# Patient Record
Sex: Male | Born: 1980
Health system: Southern US, Community
[De-identification: ages and names within clinical notes are randomized; demographics above are authoritative.]

## PROBLEM LIST (undated history)

## (undated) DIAGNOSIS — F988 Other specified behavioral and emotional disorders with onset usually occurring in childhood and adolescence: Secondary | ICD-10-CM

## (undated) HISTORY — PX: ARM WOUND REPAIR / CLOSURE: SUR1141

## (undated) HISTORY — DX: Other specified behavioral and emotional disorders with onset usually occurring in childhood and adolescence: F98.8

## (undated) HISTORY — PX: LESION REMOVAL: SHX5196

## (undated) HISTORY — PX: MANDIBLE FRACTURE SURGERY: SHX706

---

## 2018-05-27 DIAGNOSIS — T07XXXA Unspecified multiple injuries, initial encounter: Secondary | ICD-10-CM | POA: Insufficient documentation

## 2018-05-27 DIAGNOSIS — S02611A Fracture of condylar process of right mandible, initial encounter for closed fracture: Secondary | ICD-10-CM | POA: Insufficient documentation

## 2018-05-27 DIAGNOSIS — S43005A Unspecified dislocation of left shoulder joint, initial encounter: Secondary | ICD-10-CM | POA: Insufficient documentation

## 2018-05-27 DIAGNOSIS — S27321A Contusion of lung, unilateral, initial encounter: Secondary | ICD-10-CM | POA: Insufficient documentation

## 2018-05-27 DIAGNOSIS — S2242XA Multiple fractures of ribs, left side, initial encounter for closed fracture: Secondary | ICD-10-CM | POA: Insufficient documentation

## 2018-05-27 DIAGNOSIS — G8911 Acute pain due to trauma: Secondary | ICD-10-CM | POA: Insufficient documentation

## 2018-05-27 DIAGNOSIS — D735 Infarction of spleen: Secondary | ICD-10-CM | POA: Insufficient documentation

## 2018-05-27 HISTORY — DX: Acute pain due to trauma: G89.11

## 2018-05-27 HISTORY — DX: Contusion of lung, unilateral, initial encounter: S27.321A

## 2018-05-27 HISTORY — DX: Multiple fractures of ribs, left side, initial encounter for closed fracture: S22.42XA

## 2018-05-27 HISTORY — DX: Unspecified dislocation of left shoulder joint, initial encounter: S43.005A

## 2018-05-27 HISTORY — DX: Unspecified multiple injuries, initial encounter: T07.XXXA

## 2018-05-27 HISTORY — DX: Infarction of spleen: D73.5

## 2018-07-16 DIAGNOSIS — S46012A Strain of muscle(s) and tendon(s) of the rotator cuff of left shoulder, initial encounter: Secondary | ICD-10-CM

## 2018-07-16 DIAGNOSIS — R29898 Other symptoms and signs involving the musculoskeletal system: Secondary | ICD-10-CM | POA: Insufficient documentation

## 2018-07-16 DIAGNOSIS — M25511 Pain in right shoulder: Secondary | ICD-10-CM | POA: Insufficient documentation

## 2018-07-16 HISTORY — DX: Strain of muscle(s) and tendon(s) of the rotator cuff of left shoulder, initial encounter: S46.012A

## 2018-07-16 HISTORY — DX: Pain in right shoulder: M25.511

## 2018-10-04 ENCOUNTER — Other Ambulatory Visit: Payer: Self-pay

## 2018-10-04 NOTE — Telephone Encounter (Signed)
Pt needs refill of adderall but needs ov to rescheck BP prior to refill

## 2018-10-08 ENCOUNTER — Other Ambulatory Visit: Payer: Self-pay

## 2018-10-08 ENCOUNTER — Encounter: Payer: Self-pay | Admitting: Internal Medicine

## 2018-10-08 ENCOUNTER — Ambulatory Visit: Payer: Self-pay | Admitting: Internal Medicine

## 2018-10-08 VITALS — BP 130/89 | HR 108 | Temp 97.3°F | Resp 14 | Ht 75.0 in | Wt 238.0 lb

## 2018-10-08 DIAGNOSIS — E663 Overweight: Secondary | ICD-10-CM | POA: Insufficient documentation

## 2018-10-08 DIAGNOSIS — F909 Attention-deficit hyperactivity disorder, unspecified type: Secondary | ICD-10-CM

## 2018-10-08 DIAGNOSIS — R030 Elevated blood-pressure reading, without diagnosis of hypertension: Secondary | ICD-10-CM | POA: Insufficient documentation

## 2018-10-08 HISTORY — DX: Elevated blood-pressure reading, without diagnosis of hypertension: R03.0

## 2018-10-08 MED ORDER — AMPHETAMINE-DEXTROAMPHETAMINE 20 MG PO TABS: ORAL_TABLET | ORAL | 0 refills | Status: DC
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
## 2018-10-16 DIAGNOSIS — M7502 Adhesive capsulitis of left shoulder: Secondary | ICD-10-CM | POA: Insufficient documentation

## 2018-10-16 HISTORY — DX: Adhesive capsulitis of left shoulder: M75.02

## 2018-10-25 HISTORY — PX: SHOULDER SURGERY: SHX246

## 2018-11-06 ENCOUNTER — Other Ambulatory Visit: Payer: Self-pay | Admitting: Internal Medicine

## 2018-11-06 MED ORDER — AMPHETAMINE-DEXTROAMPHETAMINE 20 MG PO TABS: ORAL_TABLET | ORAL | 0 refills | Status: DC

## 2018-11-06 NOTE — Telephone Encounter (Signed)
He is requesting a refill on his adderall.   Anthony Madden s [address]

## 2018-11-06 NOTE — Telephone Encounter (Signed)
Last office visit in epic

## 2018-12-05 ENCOUNTER — Ambulatory Visit: Payer: Self-pay

## 2018-12-05 DIAGNOSIS — Z23 Encounter for immunization: Secondary | ICD-10-CM

## 2018-12-06 ENCOUNTER — Other Ambulatory Visit: Payer: Self-pay

## 2018-12-06 ENCOUNTER — Ambulatory Visit: Payer: Self-pay | Admitting: Internal Medicine

## 2018-12-06 ENCOUNTER — Encounter: Payer: Self-pay | Admitting: Internal Medicine

## 2018-12-06 VITALS — BP 135/95 | HR 98 | Temp 98.4°F | Resp 12 | Ht 76.0 in | Wt 252.0 lb

## 2018-12-06 DIAGNOSIS — Z6830 Body mass index (BMI) 30.0-30.9, adult: Secondary | ICD-10-CM

## 2018-12-06 DIAGNOSIS — E66811 Obesity, class 1: Secondary | ICD-10-CM

## 2018-12-06 DIAGNOSIS — E6609 Other obesity due to excess calories: Secondary | ICD-10-CM | POA: Insufficient documentation

## 2018-12-06 DIAGNOSIS — F909 Attention-deficit hyperactivity disorder, unspecified type: Secondary | ICD-10-CM

## 2018-12-06 DIAGNOSIS — R030 Elevated blood-pressure reading, without diagnosis of hypertension: Secondary | ICD-10-CM

## 2018-12-06 HISTORY — DX: Body mass index (BMI) 30.0-30.9, adult: Z68.30

## 2018-12-06 HISTORY — DX: Obesity, class 1: E66.811

## 2018-12-06 HISTORY — DX: Other obesity due to excess calories: E66.09

## 2018-12-06 MED ORDER — AMPHETAMINE-DEXTROAMPHETAMINE 20 MG PO TABS: ORAL_TABLET | ORAL | 0 refills | Status: DC
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
## 2018-12-06 NOTE — Progress Notes (Signed)
Left shoulder surgery 10/25/2018.  Still on PT (16 wks).  Follow-up appt with surgeon scheduled 0r 12/10/2018.  AMD

## 2018-12-11 ENCOUNTER — Encounter: Payer: Self-pay | Admitting: Occupational Medicine

## 2018-12-11 ENCOUNTER — Ambulatory Visit: Payer: 59 | Admitting: Occupational Medicine

## 2018-12-11 ENCOUNTER — Other Ambulatory Visit: Payer: Self-pay

## 2018-12-11 VITALS — BP 130/92 | HR 72 | Temp 97.1°F | Resp 12 | Ht 75.0 in | Wt 251.0 lb

## 2018-12-11 DIAGNOSIS — Z7689 Persons encountering health services in other specified circumstances: Secondary | ICD-10-CM
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
## 2018-12-26 ENCOUNTER — Ambulatory Visit: Payer: Self-pay

## 2018-12-26 DIAGNOSIS — Z021 Encounter for pre-employment examination: Secondary | ICD-10-CM

## 2018-12-28 LAB — POCT URINALYSIS DIPSTICK
Bilirubin, UA: NEGATIVE
Blood, UA: NEGATIVE
Glucose, UA: NEGATIVE
Ketones, UA: NEGATIVE
Leukocytes, UA: NEGATIVE
Nitrite, UA: NEGATIVE
Protein, UA: NEGATIVE
Spec Grav, UA: 1.01 (ref 1.010–1.025)
Urobilinogen, UA: 0.2 E.U./dL
pH, UA: 6 (ref 5.0–8.0)

## 2019-01-01 ENCOUNTER — Ambulatory Visit: Payer: 59 | Admitting: Occupational Medicine

## 2019-01-01 ENCOUNTER — Encounter: Payer: Self-pay | Admitting: Occupational Medicine

## 2019-01-01 ENCOUNTER — Other Ambulatory Visit: Payer: Self-pay

## 2019-01-01 VITALS — BP 130/80 | HR 88 | Temp 98.3°F | Resp 16 | Ht 75.0 in | Wt 255.0 lb

## 2019-01-01 DIAGNOSIS — Z0000 Encounter for general adult medical examination without abnormal findings: Secondary | ICD-10-CM

## 2019-01-09 ENCOUNTER — Encounter: Payer: Self-pay | Admitting: Internal Medicine

## 2019-01-09 ENCOUNTER — Other Ambulatory Visit: Payer: Self-pay | Admitting: Internal Medicine

## 2019-01-09 DIAGNOSIS — F909 Attention-deficit hyperactivity disorder, unspecified type: Secondary | ICD-10-CM

## 2019-01-09 MED ORDER — AMPHETAMINE-DEXTROAMPHETAMINE 20 MG PO TABS: ORAL_TABLET | ORAL | 0 refills | Status: DC

## 2019-01-09 NOTE — Telephone Encounter (Signed)
Last appt with Dr Roxan Hockey 12/06/2018.  Reviewed [place] PMP website and previous year all adderall from [place] prescribers and oxycodone only from sports med/orthopedics s/p surgery.  Patient will require face to face in Jan 2021.

## 2019-01-21 ENCOUNTER — Telehealth: Payer: Self-pay

## 2019-01-21 NOTE — Telephone Encounter (Signed)
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 Geoffry Paradise recommended a Dermatology referral for skin check. Contacted Anthony Madden to see if he wanted [place] to make the referral.  States he's going to get his own appointment with a  Dermatologist.  Advised him to let us know if he needs any assistance & he verbalized understanding.  AMD

## 2019-02-10 ENCOUNTER — Other Ambulatory Visit: Payer: Self-pay | Admitting: Registered Nurse

## 2019-02-10 DIAGNOSIS — F909 Attention-deficit hyperactivity disorder, unspecified type: Secondary | ICD-10-CM

## 2019-02-11 ENCOUNTER — Encounter: Payer: Self-pay | Admitting: Registered Nurse

## 2019-02-11 MED ORDER — AMPHETAMINE-DEXTROAMPHETAMINE 20 MG PO TABS: ORAL_TABLET | ORAL | 0 refills | Status: DC

## 2019-02-11 NOTE — Telephone Encounter (Signed)
Unable to access PMP [employer] site due to upgrade known issue and help ticket submitted to help desk and called help desk spoke to rep.  Last appt face to face with Dr Geoffry Paradise 01/01/2019 BP 130/80 HR 88  Last appt with Dr Roxan Hockey 12/06/2018.  On 01/09/2019 I reviewed [place] PMP website and previous year all adderall from [place] prescribers and oxycodone only from sports med/orthopedics s/p surgery.  Patient will require face to face by 03 Apr 2019.   Adderall 20mg  po BID #60 RF0  30 day supply sent to patient pharmacy of choice electronic Rx.  Last serum draw 12/26/2018 but unable to review results in epic at this time.  My chart message sent to patient notifying him Rx renewed and sent to his pharmacy of choice on file.

## 2019-03-14 ENCOUNTER — Other Ambulatory Visit: Payer: Self-pay

## 2019-03-14 ENCOUNTER — Ambulatory Visit: Payer: 59 | Admitting: Physician Assistant

## 2019-03-14 ENCOUNTER — Encounter: Payer: Self-pay | Admitting: Physician Assistant

## 2019-03-14 DIAGNOSIS — F909 Attention-deficit hyperactivity disorder, unspecified type: Secondary | ICD-10-CM

## 2019-03-14 MED ORDER — AMPHETAMINE-DEXTROAMPHETAMINE 20 MG PO TABS: ORAL_TABLET | ORAL | 0 refills | Status: DC
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
## 2019-04-10 ENCOUNTER — Other Ambulatory Visit: Payer: Self-pay

## 2019-04-10 DIAGNOSIS — F909 Attention-deficit hyperactivity disorder, unspecified type: Secondary | ICD-10-CM

## 2019-04-10 MED ORDER — AMPHETAMINE-DEXTROAMPHETAMINE 20 MG PO TABS: ORAL_TABLET | ORAL | 0 refills | Status: DC

## 2019-04-10 NOTE — Addendum Note (Signed)
Addended by: Albina Billet A on: 04/10/2019 01:33 PM   Modules accepted: Orders, Level of Service

## 2019-04-10 NOTE — Telephone Encounter (Signed)
Pt requesting refill of Adderall to be sent to [facility] - [address]

## 2019-04-10 NOTE — Telephone Encounter (Signed)
Patient last office visit 03/14/2019 signed new controlled substances agreement with PA Nedra Hai.  Reviewed [place] PMP website and last fill 03/15/2019 60 tabs adderall 20mg  po BID.  Last year all adderall Rx from [facility] providers.  BP stable.  EKG Oct 2020 NSR.  Labs Sep 2019 renal and liver function normal.  New Rx sent to patient pharmacy of choice.  Next face to face due Apr 2021.

## 2019-04-14 ENCOUNTER — Other Ambulatory Visit: Payer: Self-pay | Admitting: Registered Nurse

## 2019-04-14 DIAGNOSIS — F909 Attention-deficit hyperactivity disorder, unspecified type: Secondary | ICD-10-CM

## 2019-04-15 ENCOUNTER — Other Ambulatory Visit: Payer: Self-pay | Admitting: Physician Assistant

## 2019-04-15 DIAGNOSIS — F909 Attention-deficit hyperactivity disorder, unspecified type: Secondary | ICD-10-CM

## 2019-04-15 MED ORDER — AMPHETAMINE-DEXTROAMPHETAMINE 20 MG PO TABS: ORAL_TABLET | ORAL | 0 refills | Status: DC

## 2019-04-15 NOTE — Telephone Encounter (Signed)
PA Smith sent Rx today for patient to pharmacy for refill per chart review.

## 2019-05-12 ENCOUNTER — Other Ambulatory Visit: Payer: Self-pay | Admitting: Physician Assistant

## 2019-05-12 DIAGNOSIS — F909 Attention-deficit hyperactivity disorder, unspecified type: Secondary | ICD-10-CM

## 2019-05-13 ENCOUNTER — Encounter: Payer: Self-pay | Admitting: Physician Assistant

## 2019-05-13 MED ORDER — AMPHETAMINE-DEXTROAMPHETAMINE 20 MG PO TABS: ORAL_TABLET | ORAL | 0 refills | Status: DC

## 2019-05-13 NOTE — Progress Notes (Signed)
Patient comes in today for pre physical labs and EKG. Patient is scheduled with Albina Billet, PA-C on 05/27/2019.

## 2019-05-13 NOTE — Telephone Encounter (Signed)
Last fill per [place] PMP website 04/17/2019 adderall 20mg  po BID prn #60 RF0 PA  Smith.  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  Electronic Rx sent to his pharmacy adderall 20mg  po BID prn #60 RF0

## 2019-05-14 ENCOUNTER — Ambulatory Visit: Payer: Self-pay

## 2019-05-14 ENCOUNTER — Other Ambulatory Visit: Payer: Self-pay

## 2019-05-14 DIAGNOSIS — Z0000 Encounter for general adult medical examination without abnormal findings: Secondary | ICD-10-CM

## 2019-05-14 LAB — POCT URINALYSIS DIPSTICK
Bilirubin, UA: NEGATIVE
Blood, UA: NEGATIVE
Glucose, UA: NEGATIVE
Ketones, UA: NEGATIVE
Leukocytes, UA: NEGATIVE
Nitrite, UA: NEGATIVE
Protein, UA: NEGATIVE
Spec Grav, UA: 1.01 (ref 1.010–1.025)
Urobilinogen, UA: 0.2 E.U./dL
pH, UA: 6 (ref 5.0–8.0)

## 2019-05-15 LAB — CMP12+LP+TP+TSH+6AC+PSA+CBC…
ALT: 23 IU/L (ref 0–44)
AST: 23 IU/L (ref 0–40)
Albumin/Globulin Ratio: 2.3 — ABNORMAL HIGH (ref 1.2–2.2)
Albumin: 5 g/dL (ref 4.0–5.0)
Alkaline Phosphatase: 70 IU/L (ref 39–117)
BUN/Creatinine Ratio: 11 (ref 9–20)
BUN: 13 mg/dL (ref 6–20)
Basophils Absolute: 0 10*3/uL (ref 0.0–0.2)
Basos: 1 %
Bilirubin Total: 0.4 mg/dL (ref 0.0–1.2)
Calcium: 9.4 mg/dL (ref 8.7–10.2)
Chloride: 103 mmol/L (ref 96–106)
Chol/HDL Ratio: 2.9 ratio (ref 0.0–5.0)
Cholesterol, Total: 149 mg/dL (ref 100–199)
Creatinine, Ser: 1.17 mg/dL (ref 0.76–1.27)
EOS (ABSOLUTE): 0.2 10*3/uL (ref 0.0–0.4)
Eos: 4 %
Estimated CHD Risk: <0.5 times avg. (ref 0.0–1.0)
Free Thyroxine Index: 2 (ref 1.2–4.9)
GFR calc Af Amer: 90 mL/min/{1.73_m2} (ref >59)
GFR calc non Af Amer: 78 mL/min/{1.73_m2} (ref >59)
GGT: 11 IU/L (ref 0–65)
Globulin, Total: 2.2 g/dL (ref 1.5–4.5)
Glucose: 86 mg/dL (ref 65–99)
HDL: 51 mg/dL (ref >39)
Hematocrit: 43.2 % (ref 37.5–51.0)
Hemoglobin: 14.3 g/dL (ref 13.0–17.7)
Immature Grans (Abs): 0 10*3/uL (ref 0.0–0.1)
Immature Granulocytes: 0 %
Iron: 107 ug/dL (ref 38–169)
LDH: 175 IU/L (ref 121–224)
LDL Chol Calc (NIH): 81 mg/dL (ref 0–99)
Lymphocytes Absolute: 1.3 10*3/uL (ref 0.7–3.1)
Lymphs: 36 %
MCH: 28.4 pg (ref 26.6–33.0)
MCHC: 33.1 g/dL (ref 31.5–35.7)
MCV: 86 fL (ref 79–97)
Monocytes Absolute: 0.3 10*3/uL (ref 0.1–0.9)
Monocytes: 7 %
Neutrophils Absolute: 1.9 10*3/uL (ref 1.4–7.0)
Neutrophils: 52 %
Phosphorus: 3 mg/dL (ref 2.8–4.1)
Platelets: 284 10*3/uL (ref 150–450)
Potassium: 4.4 mmol/L (ref 3.5–5.2)
Prostate Specific Ag, Serum: 1.3 ng/mL (ref 0.0–4.0)
RBC: 5.04 x10E6/uL (ref 4.14–5.80)
RDW: 12.7 % (ref 11.6–15.4)
Sodium: 140 mmol/L (ref 134–144)
T3 Uptake Ratio: 29 % (ref 24–39)
T4, Total: 6.8 ug/dL (ref 4.5–12.0)
TSH: 1.19 u[IU]/mL (ref 0.450–4.500)
Total Protein: 7.2 g/dL (ref 6.0–8.5)
Triglycerides: 90 mg/dL (ref 0–149)
Uric Acid: 4.7 mg/dL (ref 3.8–8.4)
VLDL Cholesterol Cal: 17 mg/dL (ref 5–40)
WBC: 3.6 10*3/uL (ref 3.4–10.8)

## 2019-05-16 LAB — QUANTIFERON-TB GOLD PLUS
QuantiFERON Mitogen Value: >10 IU/mL
QuantiFERON Nil Value: 0.04 IU/mL
QuantiFERON TB1 Ag Value: 0.06 IU/mL
QuantiFERON TB2 Ag Value: 0.04 IU/mL
QuantiFERON-TB Gold Plus: NEGATIVE

## 2019-05-27 ENCOUNTER — Encounter: Payer: Self-pay | Admitting: Registered Nurse

## 2019-05-27 ENCOUNTER — Ambulatory Visit: Payer: Self-pay | Admitting: Registered Nurse

## 2019-05-27 ENCOUNTER — Other Ambulatory Visit: Payer: Self-pay

## 2019-05-27 VITALS — BP 133/83 | HR 86 | Temp 98.6°F | Resp 12 | Ht 75.0 in | Wt 251.0 lb

## 2019-05-27 DIAGNOSIS — Z021 Encounter for pre-employment examination: Secondary | ICD-10-CM

## 2019-05-27 DIAGNOSIS — J301 Allergic rhinitis due to pollen: Secondary | ICD-10-CM

## 2019-05-27 DIAGNOSIS — Z6831 Body mass index (BMI) 31.0-31.9, adult: Secondary | ICD-10-CM

## 2019-05-27 DIAGNOSIS — M7651 Patellar tendinitis, right knee: Secondary | ICD-10-CM

## 2019-05-27 DIAGNOSIS — T63481A Toxic effect of venom of other arthropod, accidental (unintentional), initial encounter: Secondary | ICD-10-CM | POA: Insufficient documentation

## 2019-05-27 DIAGNOSIS — Z808 Family history of malignant neoplasm of other organs or systems: Secondary | ICD-10-CM

## 2019-05-27 DIAGNOSIS — F909 Attention-deficit hyperactivity disorder, unspecified type: Secondary | ICD-10-CM

## 2019-05-27 HISTORY — DX: Body mass index (BMI) 31.0-31.9, adult: Z68.31

## 2019-05-27 MED ORDER — EPINEPHRINE 0.3 MG/0.3ML IJ SOSY: 0.3000 mg | PREFILLED_SYRINGE | INTRAMUSCULAR | 1 refills | Status: AC | PRN
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
## 2019-05-27 NOTE — Patient Instructions (Signed)
Preventing Skin Cancer, Adult Skin cancer is the most common type of cancer. There are three main types. Squamous cell and basal cell skin cancer are the most common. Melanoma skin cancer is the most dangerous type. Most skin cancers are caused by skin damage from exposure to ultraviolet (UV) light. UV light comes from the sun and from artificial tanning beds. Suntans and sunburns result from exposure to UV light. Skin cancer occurs most often in older people, but it is usually the result of damage done earlier in life. The tans and sunburns you get at any age can lead to skin cancer in the future. To help prevent this, you can take steps to protect yourself. What actions can I take to protect myself from skin cancer? Many people like to get a tan, especially in the summer or when on vacation. However, tan or burned skin is a sign of skin damage. It increases your risk for skin cancer. To lower your risk: Avoid exposure to UV light   Try to stay out of the sun between 10 a.m. and 4 p.m. whenever possible. This is when the sun is at its strongest. Seek the shade during this time.  Remember that you can also be exposed to UV rays on cloudy or hazy days. Sun exposure can be risky year-round, not just in the summer.  Do not use a sunlamp, tanning bed, or tanning booth to get a tan. If you really want a tan, use an artificial tanning lotion.  Avoid getting sunburned. Sunburns are more common on bright sunny days, especially when you are in areas where the sun is reflected off water or snow. Use sunscreen and protective clothing   Always use sunscreen--either a cream, lotion, or spray--when you are out in the sun. Keep sunscreen handy, such as in your gym bag or in your car, so that you will have it when you need it.  Use a sunscreen with a sun protection factor (SPF) of at least 15. Use an SPF of 30 or higher if you are in bright sun, especially when you are out in the snow or on the water.  Make  sure your sunscreen protects you from UVA and UVB light.  Use an adequate amount of sunscreen to cover exposed areas of skin. Put it on 30 minutes before you go out. Reapply it every 2 hours or anytime you come out of the water.  When you are out in the sun, wear a broad-brimmed hat and clothing that covers your arms and legs. Wear wraparound sunglasses. Check your skin for changes  Check your skin often from head to toe to look for any changes in the size, color, or shape of any moles or freckles. Check for any new moles or moles that bleed or become itchy. See your health care provider if you notice changes.  Ask your health care provider about a total skin check. Ask if it should be part of your yearly physical or if you need to see a skin specialist (dermatologist). Take other preventive measures   Avoid exposure to harmful chemicals, such as arsenic. ? Have your home's water tested for arsenic and other chemicals. ? Take protective measures to avoid exposure to chemicals at work.  Do not smoke any tobacco products, such as cigarettes, cigars, pipes, and e-cigarettes. If you need help quitting, ask your health care provider.  Keep your immune system healthy. ? Stay up to date on all vaccines, including the human papillomavirus (HPV) vaccine. ?   Eat at least 5 servings of fruits and vegetables every day. Why are these changes important? About 1 of every 5 people will get skin cancer. The best way to reduce your risk is to avoid skin damage from UV light. If you have teenagers in your house, they should know that just five bad sunburns as a teen could double their risk of skin cancer in the future. If you have younger children, always make sure to protect their skin from the sun. These changes can help reduce your risk of skin cancer, and they will also provide other health benefits, such as the following:  Protecting your skin from the sun can help prevent painful sunburns, sun poisoning,  and other skin damage and blemishes. This is especially important if: ? You have pale white skin, freckles, and red hair. ? You burn easily.  Avoiding exposure to harmful chemicals can help prevent damage to other tissues in your body, such as your lungs, and prevent other types of cancer.  Avoiding smoking tobacco can reduce your risk for other types of cancer and other health problems.  Eating a healthy diet is good for your overall health. What can happen if changes are not made? If you do not make these changes, you will be at higher risk for skin cancer. If you develop skin cancer, the treatments could result in lost time from work and changes in your appearance from scars. The most dangerous type of skin cancer, melanoma, can be deadly if not found early. Where to find support For more support, talk to your primary health care provider or dermatologist. Where to find more information Learn more about skin cancer from:  [employer] [place]: www.skincancer.org/prevention  The Centers for Disease Control and Prevention: FabVets.se  The American Academy of Dermatology: http://jones-macias.info/ Summary  Skin cancer is the most common type of cancer.  Melanoma skin cancer can be deadly if not found early.  Sunburns and tanning increase your risk for skin cancer.  Protecting your skin from UV light is the best way to prevent skin cancer. This information is not intended to replace advice given to you by your health care provider. Make sure you discuss any questions you have with your health care provider. Document Revised: 06/15/2018 Document Reviewed: 04/17/2017 Elsevier Patient Education  [place] Risks of Being Overweight Maintaining a healthy body weight is an important part of your overall health. Your healthy body weight depends on your age, gender, and height. Being overweight puts you at risk for many health problems, including:  Heart  disease.  Diabetes.  Problems sleeping.  Joint problems. You can make changes to your diet and lifestyle to prevent these risks. Consider working with a health care provider or a dietitian to make these changes. What nutrition changes can be made?   Eat only as much as your body needs. In most cases, this is about 2,000 calories a day, but the amount varies depending on your height, gender, and activity level. Ask your health care provider how many calories you should have each day. Eating more than your body needs on a regular basis can cause you to become overweight or obese.  Eat slowly, and stop eating when you feel full.  Choose healthy foods, including: ? Fruits and vegetables. ? Lean meats. ? Low-fat dairy products. ? High-fiber foods, such as whole grains and beans. ? Healthy snacks like vegetable sticks, a piece of fruit, or a small amount of yogurt or cheese.  Avoid foods and drinks that are high in sugar, salt (sodium), saturated fat, or trans fat. This includes: ? Many desserts such as candy, cookies, and ice cream. ? Soda. ? Fried foods. ? Processed meats such as hot dogs or lunch meats. ? Prepackaged snack foods. What lifestyle changes can be made?   Exercise for at least 150 minutes a week to prevent weight gain, or as often as recommended by your health care provider. Do moderate-intensity exercise, such as brisk walking. ? Spread it out by exercising for 30 minutes 5 days a week, or in short 10-minute bursts several times a day.  Find other ways to stay active and burn calories, such as yard work or a hobby that involves physical activity.  Get at least 8 hours of sleep each night. When you are well-rested, you are more likely to be active and make healthy choices during the day. To sleep better: ? Try to go to bed and wake up at about the same time every day. ? Keep your bedroom dark, quiet, and cool. ? Make sure that your bed is comfortable. ? Avoid  stimulating activities, such as watching television or exercising, for at least one hour before bedtime. Why are these changes important? Eating healthy and being active helps you lose weight and prevent health problems caused by being overweight. Making these changes can also help you manage stress, feel better mentally, and connect with friends and family. What can happen if changes are not made? Being overweight can affect you for your entire life. You may develop joint or bone problems that make it painful or difficult for you to play sports or do activities you enjoy. Being overweight puts stress on your heart and lungs and can lead to medical problems like diabetes, heart disease, and sleeping problems. Where to find support You can get support for preventing health risks of being overweight from:  Your health care provider or a dietitian. They can provide guidance about healthy eating and healthy lifestyle choices.  Weight loss support groups, online or in-person. Where to find more information  MyPlate: https://ball-collins.biz/www.choosemyplate.gov ? This an online tool that provides personalized recommendations about foods to eat each day.  The Centers for Disease Control and Prevention: AffordableScrapbook.glwww.cdc.gov/healthyweight ? This resource gives tips for managing weight and having an active lifestyle. Summary  To prevent unhealthy weight gain, it is important to maintain a healthy diet high in vegetables and whole grains, exercise regularly, and get at least 8 hours of sleep each night.  Making these changes helps prevent many long-term (chronic) health conditions that can shorten your life, such as diabetes, heart disease, and stroke. This information is not intended to replace advice given to you by your health care provider. Make sure you discuss any questions you have with your health care provider. Document Revised: 11/14/2018 Document Reviewed: 01/18/2017 Elsevier Patient Education  2020 Elsevier  Inc. Patellar Tendinitis Rehab Ask your health care provider which exercises are safe for you. Do exercises exactly as told by your health care provider and adjust them as directed. It is normal to feel mild stretching, pulling, tightness, or discomfort as you do these exercises. Stop right away if you feel sudden pain or your pain gets worse. Do not begin these exercises until told by your health care provider. Stretching and range-of-motion exercise This exercise warms up your muscles and joints and improves the movement and flexibility of your knee. The exercise also helps to relieve pain and stiffness. Hamstring, doorway stretch  This is an exercise in which you lie in a doorway and prop your leg on a wall to stretch the back of your knee and thigh (hamstring). 1. Lie on your back in front of a doorway with your left / right leg resting against the wall and your other leg flat on the floor in the doorway. There should be a slight bend in your left / right knee. 2. Straighten your left / right knee. You should feel a stretch behind your knee or thigh. If you do not, scoot your buttocks closer to the door. 3. Hold this position for __________ seconds. Repeat __________ times. Complete this exercise __________ times a day. Strengthening exercises These exercises build strength and endurance in your knee. Endurance is the ability to use your muscles for a long time, even after they get tired. Quadriceps, isometric This exercise stretches the muscles in front of your thigh (quadriceps) without moving your knee joint (isometric). 1. Lie on your back with your left / right leg extended and your other knee bent. 2. Slowly tense the muscles in the front of your left / right thigh. When you do this, you should see your kneecap slide up toward your hip or see increased dimpling just above the knee. This motion will push the back of your knee toward the floor. If this is painful, try putting a rolled-up hand  towel under your knee to support it in a bent position. Change the size of the towel to find a position that allows you to do this exercise without any pain. 3. For __________ seconds, hold the muscle as tight as you can without increasing your pain. 4. Relax the muscles slowly and completely. Repeat __________ times. Complete this exercise __________ times a day. Straight leg raises This exercise stretches the muscles in front of your thigh (quadriceps) and the muscles that move your hips (hip flexors). 1. Lie on your back with your left / right leg extended and your other knee bent. 2. Tense the muscles in the front of your left / right thigh. When you do this, you should see your kneecap slide up or see increased dimpling just above the knee. 3. Keep these muscles tight as you raise your leg 4-6 inches (10-15 cm) off the floor. Do not let your moving knee bend. 4. Hold this position for __________ seconds. 5. Keep these muscles tense as you slowly lower your leg. 6. Relax your muscles slowly and completely. Repeat __________ times. Complete this exercise __________ times a day. Squats This is a weight-bearing exercise in which you bend your knees and lower your hips while engaging your thigh muscles. 1. Stand in front of a table, with your feet and knees pointing straight ahead. You may rest your hands on the table for balance but not for support. 2. Slowly bend your knees and lower your hips like you are going to sit in a chair. ? Keep your weight over your heels, not over your toes. ? Keep your lower legs upright so they are parallel with the table legs. ? Do not let your hips go lower than your knees. ? Do not bend lower than told by your health care provider. ? If your knee pain increases, do not bend as low. 3. Hold the squat position for __________ seconds. 4. Slowly push with your legs to return to standing. Do not use your hands to pull yourself to standing. Repeat __________  times. Complete this exercise __________ times a day. Step-downs  This is an exercise in which you step down slowly while engaging your leg muscles. 1. Stand on the edge of a step. 2. Keeping your weight over your __________ heel, slowly bend your __________ knee to bring your __________ heel toward the floor. Lower your heel as far as you can while keeping control and without increasing any discomfort. ? Do not let your __________ knee come forward. ? Use your leg muscles, not gravity, to lower your body. ? Hold a wall or rail for balance if needed. 3. Slowly push through your heel to lift your body weight back up. 4. Return to the starting position. Repeat __________ times. Complete this exercise __________ times a day. Straight leg raises This exercise strengthens the muscles that rotate the leg at the hip and move it away from your body (hip abductors). 1. Lie on your side with your left / right leg in the top position. Lie so your head, shoulder, knee, and hip line up. You may bend your lower knee to help you keep your balance. 2. Roll your hips slightly forward, so that your hips are stacked directly over each other and your left / right knee is facing forward. 3. Leading with your heel, lift your top leg 4-6 inches (10-15 cm). You should feel the muscles in your outer hip lifting. ? Do not let your foot drift forward. ? Do not let your knee roll toward the ceiling. 4. Hold this position for __________ seconds. 5. Slowly lower your leg to the starting position. 6. Let your muscles relax completely after each repetition. Repeat __________ times. Complete this exercise __________ times a day. This information is not intended to replace advice given to you by your health care provider. Make sure you discuss any questions you have with your health care provider. Document Revised: 06/14/2018 Document Reviewed: 12/12/2017 Elsevier Patient Education  2020 Elsevier Inc. Patellar  Tendinitis  Patellar tendinitis is also called jumper's knee or patellar tendinopathy. This condition happens when there is damage to and inflammation of the patellar tendon. Tendons are cord-like tissues that connect muscles to bones. The patellar tendon connects the bottom of the kneecap (patella) to the top of the shin bone (tibia). Patellar tendinitis causes pain in the front of the knee. The condition happens in the following stages:  Stage 1: In this stage, you have pain only after activity.  Stage 2: In this stage, you have pain during and after activity.  Stage 3: In this stage, you have pain at rest as well as during and after activity. The pain limits your ability to do the activity.  Stage 4: In this stage, the tendon tears and severely limits your activity. What are the causes? This condition is caused by repeated (repetitive) stress on the tendon. This stress may cause the tendon to stretch, swell, thicken, or tear. What increases the risk? The following factors may make you more likely to develop this condition:  Participating in sports that involve running, kicking, and jumping, especially on hard surfaces. These include: ? Basketball. ? Volleyball. ? Soccer. ? Track and field.  Training too hard.  Having tight thigh muscles.  Having received steroid injections in the tendon.  Having had knee surgery.  Being 36-27 years old.  Having rheumatoid arthritis, diabetes, or kidney disease. These conditions interrupt blood flow to the knee, causing the tendon to weaken. What are the signs or symptoms? The main symptom of this condition is pain and swelling in the front of the knee.  The pain usually starts slowly and gradually gets worse. It may become painful to straighten your leg. The pain may get worse when you walk, run, or jump. How is this diagnosed? This condition may be diagnosed based on:  Your symptoms.  Your medical history.  A physical exam. During the  physical exam, your health care provider may check for: ? Tenderness in your patella. ? Tightness in your thigh muscles. ? Pain when you straighten your knee.  Imaging tests, including: ? X-rays. These will show the position and condition of your patella. ? An MRI. This will show any abnormality of the tendon. ? Ultrasound. This will show any swelling or other abnormalities of the tendon. How is this treated? Treatment for this condition depends on the stage of the condition. It may involve:  Avoiding activities that cause pain, such as jumping.  Icing and elevating your knee.  Having sound wave stimulation to promote healing.  Doing stretching and strengthening exercises (physical therapy) when pain and swelling improve.  Wearing a knee brace. This may be needed if your condition does not improve with treatment.  Using crutches or a walker. This may be needed if your condition does not improve with treatment.  Surgery. This may be done if you have stage 4 tendinitis. Follow these instructions at home: If you have a brace:  Wear the brace as told by your health care provider. Remove it only as told by your health care provider.  Loosen the brace if your toes tingle, become numb, or turn cold and blue.  Keep the brace clean.  If the brace is not waterproof: ? Do not let it get wet. ? Cover it with a watertight covering when you take a bath or shower.  Ask your health care provider when it is safe for you to drive. Managing pain, stiffness, and swelling   If directed, put ice on the injured area. ? If you have a removable brace, remove it as told by your health care provider. ? Put ice in a plastic bag. ? Place a towel between your skin and the bag. ? Leave the ice on for 20 minutes, 2-3 times a day.  Move your toes often to reduce stiffness and swelling.  Raise (elevate) your knee above the level of your heart while you are sitting or lying down. Activity  Do not  use the injured limb to support your body weight until your health care provider says that you can. Use your crutches or a walker as told by your health care provider.  Return to your normal activities as told by your health care provider. Ask your health care provider what activities are safe for you.  Do exercises as told by your health care provider or physical therapist. General instructions  Take over-the-counter and prescription medicines only as told by your health care provider.  Do not use any products that contain nicotine or tobacco, such as cigarettes, e-cigarettes, and chewing tobacco. These can delay healing. If you need help quitting, ask your health care provider.  Keep all follow-up visits as told by your health care provider. This is important. How is this prevented?  Warm up and stretch before being active.  Cool down and stretch after being active.  Give your body time to rest between periods of activity. ? You may need to reduce how often you play a sport that requires frequent jumping.  Make sure to use equipment that fits you.  Be safe and responsible while  being active. This will help you avoid falls which can damage the tendon.  Do at least 150 minutes of moderate-intensity exercise each week, such as brisk walking or water aerobics.  Maintain physical fitness, including: ? Strength. ? Flexibility. ? Cardiovascular fitness. ? Endurance. Contact a health care provider if:  Your symptoms have not improved in 6 weeks.  Your symptoms get worse. Summary  Patellar tendinitis is also called jumper's knee or patellar tendinopathy. This condition happens when there is damage to and inflammation of the patellar tendon.  Treatment for this condition depends on the stage of the condition and may include rest, ice, exercises, medicines, and surgery.  Do not use the injured limb to support your body weight until your health care provider says that you  can.  Take over-the-counter and prescription medicines only as told by your health care provider.  Keep all follow-up visits as told by your health care provider. This is important. This information is not intended to replace advice given to you by your health care provider. Make sure you discuss any questions you have with your health care provider. Document Revised: 06/14/2018 Document Reviewed: 01/15/2018 Elsevier Patient Education  [place].

## 2019-05-27 NOTE — Progress Notes (Signed)
Presents to complete Firefighter Physical for 2021.  Seasonal Allergies - using OTC meds  AMD

## 2019-06-13 ENCOUNTER — Encounter: Payer: Self-pay | Admitting: Physician Assistant

## 2019-06-13 ENCOUNTER — Ambulatory Visit: Payer: Self-pay | Admitting: Physician Assistant

## 2019-06-13 ENCOUNTER — Other Ambulatory Visit: Payer: Self-pay

## 2019-06-13 DIAGNOSIS — F909 Attention-deficit hyperactivity disorder, unspecified type: Secondary | ICD-10-CM

## 2019-06-13 MED ORDER — AMPHETAMINE-DEXTROAMPHETAMINE 20 MG PO TABS: ORAL_TABLET | ORAL | 0 refills | Status: DC

## 2019-06-13 NOTE — Progress Notes (Signed)
   Subjective: ADHD    Patient ID: Anthony Madden, male    DOB: 1980-03-09, 39 y.o.   MRN: [id number]  HPI Patient presents for 82-month face-to-face meeting for renewal of prescription for Adderall.  Patient states tolerating medication   Review of Systems ADHD    Objective:   Physical Exam Physical exam is deferred.       Assessment & Plan: ADHD medication refill  Prescription refill full Adderall 20 mg twice daily.  Patient will follow up in 3 months.

## 2019-06-13 NOTE — Progress Notes (Signed)
3 month face to face visit to continue Adderall refills.  AMD

## 2019-07-11 ENCOUNTER — Other Ambulatory Visit: Payer: Self-pay | Admitting: Physician Assistant

## 2019-07-11 DIAGNOSIS — F909 Attention-deficit hyperactivity disorder, unspecified type: Secondary | ICD-10-CM

## 2019-07-11 MED ORDER — AMPHETAMINE-DEXTROAMPHETAMINE 20 MG PO TABS: ORAL_TABLET | ORAL | 0 refills | Status: DC

## 2019-08-10 ENCOUNTER — Other Ambulatory Visit: Payer: Self-pay | Admitting: Physician Assistant

## 2019-08-10 DIAGNOSIS — F909 Attention-deficit hyperactivity disorder, unspecified type: Secondary | ICD-10-CM

## 2019-08-12 MED ORDER — AMPHETAMINE-DEXTROAMPHETAMINE 20 MG PO TABS: ORAL_TABLET | ORAL | 0 refills | Status: DC

## 2019-09-11 ENCOUNTER — Other Ambulatory Visit: Payer: Self-pay

## 2019-09-11 DIAGNOSIS — F909 Attention-deficit hyperactivity disorder, unspecified type: Secondary | ICD-10-CM

## 2019-09-12 MED ORDER — AMPHETAMINE-DEXTROAMPHETAMINE 20 MG PO TABS: ORAL_TABLET | ORAL | 0 refills | Status: DC

## 2019-10-14 ENCOUNTER — Other Ambulatory Visit: Payer: Self-pay

## 2019-10-14 DIAGNOSIS — F909 Attention-deficit hyperactivity disorder, unspecified type: Secondary | ICD-10-CM

## 2019-10-14 MED ORDER — AMPHETAMINE-DEXTROAMPHETAMINE 20 MG PO TABS: ORAL_TABLET | ORAL | 0 refills | Status: DC

## 2019-10-14 NOTE — Progress Notes (Unsigned)
Rx refill for Adderall 

## 2019-11-13 ENCOUNTER — Other Ambulatory Visit: Payer: Self-pay

## 2019-11-13 DIAGNOSIS — F909 Attention-deficit hyperactivity disorder, unspecified type: Secondary | ICD-10-CM

## 2019-11-13 MED ORDER — AMPHETAMINE-DEXTROAMPHETAMINE 20 MG PO TABS: ORAL_TABLET | ORAL | 0 refills | Status: DC

## 2019-12-02 ENCOUNTER — Other Ambulatory Visit: Payer: Self-pay

## 2019-12-02 DIAGNOSIS — Z1152 Encounter for screening for COVID-19: Secondary | ICD-10-CM

## 2019-12-02 NOTE — Progress Notes (Signed)
covid testing - exposure 11/28/19  [facility]

## 2019-12-04 LAB — SARS-COV-2, NAA 2 DAY TAT

## 2019-12-04 LAB — NOVEL CORONAVIRUS, NAA: SARS-CoV-2, NAA: NOT DETECTED

## 2019-12-11 ENCOUNTER — Ambulatory Visit: Payer: Self-pay | Admitting: Emergency Medicine

## 2019-12-11 ENCOUNTER — Encounter: Payer: Self-pay | Admitting: Emergency Medicine

## 2019-12-11 ENCOUNTER — Other Ambulatory Visit: Payer: Self-pay

## 2019-12-11 DIAGNOSIS — F909 Attention-deficit hyperactivity disorder, unspecified type: Secondary | ICD-10-CM

## 2019-12-11 MED ORDER — AMPHETAMINE-DEXTROAMPHETAMINE 20 MG PO TABS: ORAL_TABLET | ORAL | 0 refills | Status: DC
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
## 2019-12-11 NOTE — Progress Notes (Signed)
Pt presents today for adderall refill. CL,RMA

## 2019-12-13 ENCOUNTER — Other Ambulatory Visit: Payer: Self-pay | Admitting: Emergency Medicine

## 2019-12-13 DIAGNOSIS — F909 Attention-deficit hyperactivity disorder, unspecified type: Secondary | ICD-10-CM

## 2020-01-09 ENCOUNTER — Other Ambulatory Visit: Payer: Self-pay | Admitting: Emergency Medicine

## 2020-01-09 DIAGNOSIS — F909 Attention-deficit hyperactivity disorder, unspecified type: Secondary | ICD-10-CM

## 2020-01-09 MED ORDER — AMPHETAMINE-DEXTROAMPHETAMINE 20 MG PO TABS: ORAL_TABLET | ORAL | 0 refills | Status: DC

## 2020-01-10 ENCOUNTER — Other Ambulatory Visit: Payer: Self-pay

## 2020-01-10 DIAGNOSIS — F909 Attention-deficit hyperactivity disorder, unspecified type: Secondary | ICD-10-CM

## 2020-01-16 MED ORDER — AMPHETAMINE-DEXTROAMPHETAMINE 20 MG PO TABS: ORAL_TABLET | ORAL | 0 refills | Status: DC

## 2020-02-12 ENCOUNTER — Other Ambulatory Visit: Payer: Self-pay

## 2020-02-12 DIAGNOSIS — F909 Attention-deficit hyperactivity disorder, unspecified type: Secondary | ICD-10-CM

## 2020-02-12 MED ORDER — AMPHETAMINE-DEXTROAMPHETAMINE 20 MG PO TABS: ORAL_TABLET | ORAL | 0 refills | Status: DC

## 2020-03-13 ENCOUNTER — Other Ambulatory Visit: Payer: Self-pay

## 2020-03-13 DIAGNOSIS — F909 Attention-deficit hyperactivity disorder, unspecified type: Secondary | ICD-10-CM

## 2020-03-13 MED ORDER — AMPHETAMINE-DEXTROAMPHETAMINE 20 MG PO TABS: ORAL_TABLET | ORAL | 0 refills | Status: DC

## 2020-04-15 ENCOUNTER — Other Ambulatory Visit: Payer: Self-pay

## 2020-04-15 ENCOUNTER — Other Ambulatory Visit: Payer: Self-pay | Admitting: Adult Medicine

## 2020-04-15 DIAGNOSIS — F909 Attention-deficit hyperactivity disorder, unspecified type: Secondary | ICD-10-CM

## 2020-04-15 MED ORDER — AMPHETAMINE-DEXTROAMPHETAMINE 20 MG PO TABS: ORAL_TABLET | ORAL | 0 refills | Status: DC

## 2020-05-13 ENCOUNTER — Other Ambulatory Visit: Payer: Self-pay

## 2020-05-13 DIAGNOSIS — F909 Attention-deficit hyperactivity disorder, unspecified type: Secondary | ICD-10-CM

## 2020-05-13 MED ORDER — AMPHETAMINE-DEXTROAMPHETAMINE 20 MG PO TABS: ORAL_TABLET | ORAL | 0 refills | Status: DC

## 2020-06-10 ENCOUNTER — Other Ambulatory Visit (HOSPITAL_BASED_OUTPATIENT_CLINIC_OR_DEPARTMENT_OTHER): Payer: Self-pay | Admitting: Nurse Practitioner

## 2020-06-12 ENCOUNTER — Other Ambulatory Visit (HOSPITAL_BASED_OUTPATIENT_CLINIC_OR_DEPARTMENT_OTHER)
Admission: RE | Admit: 2020-06-12 | Discharge: 2020-06-12 | Disposition: A | Discharge status: Home / Self Care | Payer: BC Managed Care – PPO | Source: Ambulatory Visit | Attending: Nurse Practitioner | Admitting: Nurse Practitioner

## 2020-06-12 ENCOUNTER — Ambulatory Visit (HOSPITAL_BASED_OUTPATIENT_CLINIC_OR_DEPARTMENT_OTHER): Payer: BC Managed Care – PPO | Admitting: Nurse Practitioner

## 2020-06-12 ENCOUNTER — Encounter (HOSPITAL_BASED_OUTPATIENT_CLINIC_OR_DEPARTMENT_OTHER): Payer: Self-pay | Admitting: Nurse Practitioner

## 2020-06-12 ENCOUNTER — Other Ambulatory Visit: Payer: Self-pay

## 2020-06-12 VITALS — BP 128/62 | HR 77 | Ht 76.0 in | Wt 258.0 lb

## 2020-06-12 DIAGNOSIS — Z7689 Persons encountering health services in other specified circumstances: Secondary | ICD-10-CM

## 2020-06-12 DIAGNOSIS — Z0000 Encounter for general adult medical examination without abnormal findings: Secondary | ICD-10-CM | POA: Diagnosis not present

## 2020-06-12 DIAGNOSIS — F909 Attention-deficit hyperactivity disorder, unspecified type: Secondary | ICD-10-CM | POA: Diagnosis not present

## 2020-06-12 DIAGNOSIS — Z6831 Body mass index (BMI) 31.0-31.9, adult: Secondary | ICD-10-CM

## 2020-06-12 HISTORY — DX: Encounter for general adult medical examination without abnormal findings: Z00.00

## 2020-06-12 HISTORY — DX: Persons encountering health services in other specified circumstances: Z76.89

## 2020-06-12 LAB — CBC WITH DIFFERENTIAL/PLATELET
Abs Immature Granulocytes: 0.02 10*3/uL (ref 0.00–0.07)
Basophils Absolute: 0 10*3/uL (ref 0.0–0.1)
Basophils Relative: 0 %
Eosinophils Absolute: 0.1 10*3/uL (ref 0.0–0.5)
Eosinophils Relative: 2 %
HCT: 41.4 % (ref 39.0–52.0)
Hemoglobin: 14.2 g/dL (ref 13.0–17.0)
Immature Granulocytes: 0 %
Lymphocytes Relative: 31 %
Lymphs Abs: 1.6 10*3/uL (ref 0.7–4.0)
MCH: 28.1 pg (ref 26.0–34.0)
MCHC: 34.3 g/dL (ref 30.0–36.0)
MCV: 82 fL (ref 80.0–100.0)
Monocytes Absolute: 0.3 10*3/uL (ref 0.1–1.0)
Monocytes Relative: 5 %
Neutro Abs: 3.1 10*3/uL (ref 1.7–7.7)
Neutrophils Relative %: 62 %
Platelets: 228 10*3/uL (ref 150–400)
RBC: 5.05 MIL/uL (ref 4.22–5.81)
RDW: 12.8 % (ref 11.5–15.5)
WBC: 5.2 10*3/uL (ref 4.0–10.5)
nRBC: 0 % (ref 0.0–0.2)

## 2020-06-12 LAB — LIPID PANEL
Cholesterol: 135 mg/dL (ref 0–200)
HDL: 60 mg/dL (ref >40)
LDL Cholesterol: 65 mg/dL (ref 0–99)
Total CHOL/HDL Ratio: 2.3 RATIO
Triglycerides: 52 mg/dL (ref <150)
VLDL: 10 mg/dL (ref 0–40)

## 2020-06-12 LAB — COMPREHENSIVE METABOLIC PANEL
ALT: 28 U/L (ref 0–44)
AST: 21 U/L (ref 15–41)
Albumin: 4.7 g/dL (ref 3.5–5.0)
Alkaline Phosphatase: 53 U/L (ref 38–126)
Anion gap: 8 (ref 5–15)
BUN: 12 mg/dL (ref 6–20)
CO2: 27 mmol/L (ref 22–32)
Calcium: 9.5 mg/dL (ref 8.9–10.3)
Chloride: 104 mmol/L (ref 98–111)
Creatinine, Ser: 1.07 mg/dL (ref 0.61–1.24)
GFR, Estimated: >60 mL/min (ref >60)
Glucose, Bld: 92 mg/dL (ref 70–99)
Potassium: 4.2 mmol/L (ref 3.5–5.1)
Sodium: 139 mmol/L (ref 135–145)
Total Bilirubin: 0.6 mg/dL (ref 0.3–1.2)
Total Protein: 7.5 g/dL (ref 6.5–8.1)

## 2020-06-12 MED ORDER — AMPHETAMINE-DEXTROAMPHETAMINE 20 MG PO TABS: 20.0000 mg | ORAL_TABLET | Freq: Two times a day (BID) | ORAL | 0 refills | Status: DC

## 2020-06-12 MED ORDER — AMPHETAMINE-DEXTROAMPHETAMINE 20 MG PO TABS: ORAL_TABLET | ORAL | 0 refills | Status: DC

## 2020-06-12 NOTE — Assessment & Plan Note (Signed)
BMI 31.40 today with inaccurate assessment of BMI based on body fat vs. Muscle mass in weight training individual.  Recommend continued exercise and dietary routine with avoidance of saturated fats, carbohydrates, and processed foods.  Will follow labs for evaluation of metabolic processes that may contribute to chronic illness.

## 2020-06-12 NOTE — Assessment & Plan Note (Signed)
Long standing history of ADHD well controlled with Adderall 20mg  BID.  No concerning findings present today. Will plan to continue current dosing of medication with 3 month follow-ups.  PDMP reviewed today.  Follow up in 3 months or sooner if needed.

## 2020-06-12 NOTE — Assessment & Plan Note (Signed)
Annual physical exam on 40 year old male without significant chronic medical history.  Evaluations of ROS and physical exam performed without significant abnormalities noted.  Will obtain labs today for annual exam and routine monitoring.  No changes to plan of care at this time.  Will update patient if any changes need to be made based on lab results.

## 2020-06-12 NOTE — Assessment & Plan Note (Signed)
New patient to practice.  Review of past medical history, surgical history, medication, family history, and social history performed. Complete PE performed today.  Recommendations for health maintenance provided and discussed with patient.
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
## 2020-06-12 NOTE — Patient Instructions (Addendum)
Recommendations from today's visit:  I have ordered labs- Judson Roch will help you get checked in for those after this visit.   I sent 3 months of refills for your ADHD medication. We will plan on a virtual visit in 3 months or sooner, if needed.   We will plan on an annual physical exam about this time next year.   If you have any acute needs, please let us know.    __________________________________________________________________________ Thank you for choosing [place] at [facility] for your Primary Care needs. I am excited for the opportunity to partner with you to meet your health care goals. It was a pleasure meeting you today!  I am an Adult-Geriatric Nurse Practitioner with a background in caring for patients for more than 20 years. I received my Paediatric nurse in Nursing and my Doctor of Nursing Practice degrees at [employer]. I received additional fellowship training in primary care and sports medicine after receiving my doctorate degree. I provide primary care and sports medicine services to patients age 24 and older within this office. I am also a provider with the [facility] and the director of the APP Fellowship with [facility].  I am a [place] native, but have called the [place] area home for nearly 20 years and am proud to be a member of this community.   I am passionate about providing the best service to you through preventive medicine and supportive care. I consider you a part of the medical team and value your input. I work diligently to ensure that you are heard and your needs are met in a safe and effective manner. I want you to feel comfortable with me as your provider and want you to know that your health concerns are important to me.   For your information, our office hours are Monday- Friday 8:00 AM - 5:00 PM At this time I am not in the office on Wednesdays.  If you have questions or concerns,  please call our office at [phone] or send [place] a MyChart message and we will respond as quickly as possible.   For all urgent or time sensitive needs we ask that you please call the office to avoid delays. MyChart is not constantly monitored and replies may take up to 72 business hours.  MyChart Policy: . MyChart allows for you to see your visit notes, after visit summary, provider recommendations, lab and tests results, make an appointment, request refills, and contact your provider or the office for non-urgent questions or concerns.  . Providers are seeing patients during normal business hours and do not have built in time to review MyChart messages. We ask that you allow a minimum of 72 business hours for MyChart message responses.  . Complex MyChart concerns may require a visit. Your provider may request you schedule a virtual or in person visit to ensure we are providing the best care possible. . MyChart messages sent after 4:00 PM on Friday will not be received by the provider until Monday morning.    Lab and Test Results: . You will receive your lab and test results on MyChart as soon as they are completed and results have been sent by the lab or testing facility. Due to this service, you will receive your results BEFORE your provider.  . Please allow a minimum of 72 business hours for your provider to receive and review lab and test results and contact you about.   Marland Kitchen  Most lab and test result comments from the provider will be sent through [place]. Your provider may recommend changes to the plan of care, follow-up visits, repeat testing, ask questions, or request an office visit to discuss these results. You may reply directly to this message or call the office at [phone] to provide information for the provider or set up an appointment. . In some instances, you will be called with test results and recommendations. Please let us know if this is preferred and we will make note of this in  your chart to provide this for you.    . If you have not heard a response to your lab or test results in 72 business hours, please call the office to let us know.   After Hours: . For all non-emergency after hours needs, please call the office at [phone] and select the option to reach the on-call provider service. On-call services are shared between multiple [place] offices and therefore it will not be possible to speak directly with your provider. On-call providers may provide medical advice and recommendations, but are unable to provide refills for maintenance medications.  . For all emergency or urgent medical needs after normal business hours, we recommend that you seek care at the closest Urgent Care or Emergency Department to ensure appropriate treatment in a timely manner.  [facility] [place] has a 24 hour emergency room located on the ground floor for your convenience.    Please do not hesitate to reach out to [place] with concerns.   Thank you, again, for choosing me as your health care partner. I appreciate your trust and look forward to learning more about you.   Worthy Keeler, DNP, AGNP-c _____________________________________________________________________________________  Health Maintenance Recommendations Screening Testing  Mammogram  Every 1 -2 years based on history and risk factors  Starting at age 47  Pap Smear  Ages 21-39 every 3 years  Ages 38-65 every 5 years with HPV testing  More frequent testing may be required based on results and history  Colon Cancer Screening  Every 1-10 years based on test performed, risk factors, and history  Starting at age 74  Bone Density Screening  Every 2-10 years based on history  Starting at age 30 for women  Recommendations for men differ based on medication usage, history, and risk factors  AAA Screening  One time ultrasound  Men 44-81 years old who have every smoked  Lung Cancer  Screening  Low Dose Lung CT every 12 months  Age 7-80 years with a 30 pack-year smoking history who still smoke or who have quit within the last 15 years  Screening Labs  Routine  Labs: Complete Blood Count (CBC), Complete Metabolic Panel (CMP), Cholesterol (Lipid Panel)  Every 6-12 months based on history and medications  May be recommended more frequently based on current conditions or previous results  Hemoglobin A1c Lab  Every 3-12 months based on history and previous results  Starting at age 44 or earlier with diagnosis of diabetes, high cholesterol, BMI >26, and/or risk factors  Frequent monitoring for patients with diabetes to ensure blood sugar control  Thyroid Panel (TSH w/ T3 & T4)  Every 6 months based on history, symptoms, and risk factors  May be repeated more often if on medication  HIV  One time testing for all patients 47 and older  May be repeated more frequently for patients with increased risk factors or exposure  Hepatitis C  One time testing for all  patients 47 and older  May be repeated more frequently for patients with increased risk factors or exposure  Gonorrhea, Chlamydia  Every 12 months for all sexually active persons 13-24 years  Additional monitoring may be recommended for those who are considered high risk or who have symptoms  PSA  Men 6-71 years old with risk factors  Additional screening may be recommended from age 68-69 based on risk factors, symptoms, and history  Vaccine Recommendations  Tetanus Booster  All adults every 10 years  Flu Vaccine  All patients 6 months and older every year  COVID Vaccine  All patients 12 years and older  Initial dosing with booster  May recommend additional booster based on age and health history  HPV Vaccine  2 doses all patients age 19-26  Dosing may be considered for patients over 26  Shingles Vaccine (Shingrix)  2 doses all adults 53 years and older  Pneumonia  (Pneumovax 23)  All adults 76 years and older  May recommend earlier dosing based on health history  Pneumonia (Prevnar 11)  All adults 38 years and older  Dosed 1 year after Pneumovax 23  Additional Screening, Testing, and Vaccinations may be recommended on an individualized basis based on family history, health history, risk factors, and/or exposure.

## 2020-06-15 NOTE — Progress Notes (Signed)
All labs normal.  No changes to the plan of care.

## 2020-06-18 ENCOUNTER — Telehealth (HOSPITAL_BASED_OUTPATIENT_CLINIC_OR_DEPARTMENT_OTHER): Payer: Self-pay

## 2020-06-18 NOTE — Telephone Encounter (Signed)
Left message for patient to call back for results and recommendations. 

## 2020-06-18 NOTE — Telephone Encounter (Signed)
-----   Message from Tollie Eth, NP sent at 06/15/2020  3:19 PM EDT ----- All labs normal.  No changes to the plan of care.

## 2020-06-18 NOTE — Telephone Encounter (Signed)
Patient returned phone call.  All labs are normal.

## 2020-07-16 ENCOUNTER — Encounter (HOSPITAL_BASED_OUTPATIENT_CLINIC_OR_DEPARTMENT_OTHER): Payer: Self-pay | Admitting: Nurse Practitioner

## 2020-08-15 ENCOUNTER — Other Ambulatory Visit (HOSPITAL_BASED_OUTPATIENT_CLINIC_OR_DEPARTMENT_OTHER): Payer: Self-pay | Admitting: Nurse Practitioner

## 2020-08-15 ENCOUNTER — Encounter (HOSPITAL_BASED_OUTPATIENT_CLINIC_OR_DEPARTMENT_OTHER): Payer: Self-pay | Admitting: Nurse Practitioner

## 2020-08-15 DIAGNOSIS — F909 Attention-deficit hyperactivity disorder, unspecified type: Secondary | ICD-10-CM

## 2020-09-14 ENCOUNTER — Other Ambulatory Visit (HOSPITAL_BASED_OUTPATIENT_CLINIC_OR_DEPARTMENT_OTHER): Payer: Self-pay | Admitting: Nurse Practitioner

## 2020-09-14 ENCOUNTER — Encounter (HOSPITAL_BASED_OUTPATIENT_CLINIC_OR_DEPARTMENT_OTHER): Payer: Self-pay | Admitting: Nurse Practitioner

## 2020-09-14 DIAGNOSIS — F909 Attention-deficit hyperactivity disorder, unspecified type: Secondary | ICD-10-CM

## 2020-09-15 ENCOUNTER — Telehealth (HOSPITAL_BASED_OUTPATIENT_CLINIC_OR_DEPARTMENT_OTHER): Payer: Self-pay

## 2020-09-15 ENCOUNTER — Other Ambulatory Visit (HOSPITAL_BASED_OUTPATIENT_CLINIC_OR_DEPARTMENT_OTHER): Payer: Self-pay | Admitting: Nurse Practitioner

## 2020-09-15 DIAGNOSIS — F909 Attention-deficit hyperactivity disorder, unspecified type: Secondary | ICD-10-CM

## 2020-09-15 DIAGNOSIS — H60509 Unspecified acute noninfective otitis externa, unspecified ear: Secondary | ICD-10-CM

## 2020-09-15 MED ORDER — NEOMYCIN-POLYMYXIN-HC 3.5-10000-1 OT SOLN: OTIC | 0 refills | Status: DC

## 2020-09-15 MED ORDER — AMPHETAMINE-DEXTROAMPHETAMINE 20 MG PO TABS: 20.0000 mg | ORAL_TABLET | Freq: Two times a day (BID) | ORAL | 0 refills | Status: DC

## 2020-09-15 MED ORDER — AMPHETAMINE-DEXTROAMPHETAMINE 20 MG PO TABS
ORAL_TABLET | ORAL | 0 refills | Status: DC
Start: 2020-09-15 — End: 2020-12-11

## 2020-09-15 NOTE — Telephone Encounter (Signed)
Patient called asking for a refill on his Adderall 20mg  to be filled today.  He has an appointment with you on 7/14.  He also states he has an inner ear infection and would like a prescription sent in for that as well to 8/14 in [place] on [address].

## 2020-09-17 ENCOUNTER — Telehealth (INDEPENDENT_AMBULATORY_CARE_PROVIDER_SITE_OTHER): Payer: BC Managed Care – PPO | Admitting: Nurse Practitioner

## 2020-09-17 ENCOUNTER — Encounter (HOSPITAL_BASED_OUTPATIENT_CLINIC_OR_DEPARTMENT_OTHER): Payer: Self-pay | Admitting: Nurse Practitioner

## 2020-09-17 ENCOUNTER — Other Ambulatory Visit: Payer: Self-pay

## 2020-09-17 DIAGNOSIS — H60509 Unspecified acute noninfective otitis externa, unspecified ear: Secondary | ICD-10-CM | POA: Diagnosis not present

## 2020-09-17 DIAGNOSIS — F900 Attention-deficit hyperactivity disorder, predominantly inattentive type: Secondary | ICD-10-CM

## 2020-09-17 HISTORY — DX: Unspecified acute noninfective otitis externa, unspecified ear: H60.509

## 2020-09-17 NOTE — Assessment & Plan Note (Signed)
Antibiotic ear drops working well for him. No systemic symptoms.  No SE Recommend continue abx for complete course and avoid getting water into the ear. F/U if new symptoms occur or if symptoms worsen or fail to improve.

## 2020-09-17 NOTE — Progress Notes (Signed)
Virtual Visit via Telephone Note  I connected with  Anthony Madden on 09/17/20 at 10:10 AM EDT by telephone and verified that I am speaking with the correct person using two identifiers.   I discussed the limitations, risks, security and privacy concerns of performing an evaluation and management service by telephone and the availability of in person appointments. I also discussed with the patient that there may be a patient responsible charge related to this service. The patient expressed understanding and agreed to proceed.  Participating parties included in this telephone visit include: The patient and the nurse practitioner listed.  The patient is: At home I am: In the office  Subjective:    CC: Ear Infection and F/U ADHD  HPI: Anthony Madden is a 40 y.o. year old male presenting today via telephone visit to discuss ADHD medication and ear infection.  ADHD FOLLOW UP Current Medication: Adderall 20mg  BID ADHD status: controlled Satisfied with current therapy: yes Medication compliance:  excellent compliance Taking meds on weekends/vacations: occasionally Work/school performance:  excellent ADHD Medication Side Effects: no    Decreased appetite: no    Headache: no    Sleeping disturbance pattern: no    Irritability: no    Rebound effects (worse than baseline) off medication: no    Anxiousness: no    Dizziness: no    Tics: no  Ear Infection Endorses "crusty drainage" from the ear for about 2 weeks. Earlier this week while dropping a patient off in the ED noticed that the ear was tender. ED provider looked in the ear and reported otitis externa and recommended he call PCP for antibiotic drops.  Drops were sent in for him on Tuesday.  Since using the drops he reports that the symptoms are much improved. The pain has nearly subsided.  He denies any additional drainage, blood, crusting, fevers, chills, sore throat, or sinus symptoms.    Past medical history, Surgical history, Family  history not pertinant except as noted below, Social history, Allergies, and medications have been entered into the medical record, reviewed, and corrections made.   Review of Systems:  All review of systems negative except what is listed in the HPI  Objective:    General:  Patient speaking clearly in complete sentences. No shortness of breath noted.   Alert and oriented x3.   Normal judgment.  No apparent acute distress.  Impression and Recommendations:    Problem List Items Addressed This Visit     Attention deficit hyperactivity disorder (ADHD) - Primary    Doing well on current regimen of Adderall 20mg  BID No SE or sleep disruption No changes to medication or plan of care at this time. F/U with [facility] note in 3 months- if sx are stable will send refills- if re-evaluation needed will schedule appt.  Next appt in 6 months       Acute otitis externa    Antibiotic ear drops working well for him. No systemic symptoms.  No SE Recommend continue abx for complete course and avoid getting water into the ear. F/U if new symptoms occur or if symptoms worsen or fail to improve.              I discussed the assessment and treatment plan with the patient. The patient was provided an opportunity to ask questions and all were answered. The patient agreed with the plan and demonstrated an understanding of the instructions.   The patient was advised to call back or seek an in-person evaluation if the  symptoms worsen or if the condition fails to improve as anticipated.  I provided 11 minutes of non-face-to-face time during this TELEPHONE encounter.    Tollie Eth, NP

## 2020-09-17 NOTE — Assessment & Plan Note (Signed)
Doing well on current regimen of Adderall 20mg  BID No SE or sleep disruption No changes to medication or plan of care at this time. F/U with [facility] note in 3 months- if sx are stable will send refills- if re-evaluation needed will schedule appt.  Next appt in 6 months

## 2020-09-17 NOTE — Patient Instructions (Signed)
Problem List Items Addressed This Visit     Attention deficit hyperactivity disorder (ADHD) - Primary    Doing well on current regimen of Adderall 20mg  BID No SE or sleep disruption No changes to medication or plan of care at this time. F/U with [facility] note in 3 months- if sx are stable will send refills- if re-evaluation needed will schedule appt.  Next appt in 6 months       Acute otitis externa    Antibiotic ear drops working well for him. No systemic symptoms.  No SE Recommend continue abx for complete course and avoid getting water into the ear. F/U if new symptoms occur or if symptoms worsen or fail to improve.        If your ear gets any worse, let me know and we can send oral antibiotic treatment.   Send me a MyChart message in 3 months to let me know how your medication is doing and if you are having any side effects. If all is stable, I can send in an additional 3 month supply. If things have changed, please let me know and we will have a visit to discuss.   F/U in 6 months with visit.

## 2020-09-18 ENCOUNTER — Encounter (HOSPITAL_BASED_OUTPATIENT_CLINIC_OR_DEPARTMENT_OTHER): Payer: Self-pay | Admitting: Nurse Practitioner

## 2020-09-18 ENCOUNTER — Other Ambulatory Visit (HOSPITAL_BASED_OUTPATIENT_CLINIC_OR_DEPARTMENT_OTHER): Payer: Self-pay

## 2020-09-29 ENCOUNTER — Encounter (HOSPITAL_BASED_OUTPATIENT_CLINIC_OR_DEPARTMENT_OTHER): Payer: Self-pay | Admitting: Nurse Practitioner

## 2020-10-01 ENCOUNTER — Ambulatory Visit (INDEPENDENT_AMBULATORY_CARE_PROVIDER_SITE_OTHER): Payer: BC Managed Care – PPO | Admitting: Nurse Practitioner

## 2020-10-01 ENCOUNTER — Ambulatory Visit (HOSPITAL_BASED_OUTPATIENT_CLINIC_OR_DEPARTMENT_OTHER)
Admission: RE | Admit: 2020-10-01 | Discharge: 2020-10-01 | Disposition: A | Discharge status: Home / Self Care | Payer: BC Managed Care – PPO | Source: Ambulatory Visit | Attending: Nurse Practitioner | Admitting: Nurse Practitioner

## 2020-10-01 ENCOUNTER — Other Ambulatory Visit: Payer: Self-pay

## 2020-10-01 DIAGNOSIS — Z23 Encounter for immunization: Secondary | ICD-10-CM | POA: Diagnosis not present

## 2020-10-01 DIAGNOSIS — R7611 Nonspecific reaction to tuberculin skin test without active tuberculosis: Secondary | ICD-10-CM | POA: Diagnosis not present

## 2020-10-01 DIAGNOSIS — Z9289 Personal history of other medical treatment: Secondary | ICD-10-CM | POA: Diagnosis not present

## 2020-10-01 NOTE — Progress Notes (Signed)
Patient came in for nurse visit for immunization titers Orders placed for chest xray for positive PPD Patient will need a letter stating he will need to return in sept/oct for a influenza vaccination

## 2020-10-02 ENCOUNTER — Encounter (HOSPITAL_BASED_OUTPATIENT_CLINIC_OR_DEPARTMENT_OTHER): Payer: Self-pay

## 2020-10-02 ENCOUNTER — Encounter (HOSPITAL_BASED_OUTPATIENT_CLINIC_OR_DEPARTMENT_OTHER): Payer: Self-pay | Admitting: Nurse Practitioner

## 2020-10-02 LAB — VARICELLA ZOSTER ABS, IGG/IGM
Varicella IgM: <0.91 index (ref 0.00–0.90)
Varicella zoster IgG: 1175 index (ref >165)

## 2020-10-02 LAB — MEASLES/MUMPS/RUBELLA IMMUNITY
MUMPS ABS, IGG: 29.6 AU/mL (ref >10.9)
RUBEOLA AB, IGG: <13.5 AU/mL — ABNORMAL LOW (ref >16.4)
Rubella Antibodies, IGG: 1.36 index (ref >0.99)

## 2020-10-02 LAB — HEPATITIS B SURFACE ANTIBODY,QUALITATIVE: Hep B Surface Ab, Qual: NONREACTIVE

## 2020-10-02 NOTE — Progress Notes (Signed)
CXR negative for infection/disease after positive PPD test.   No further concerns

## 2020-10-05 NOTE — Progress Notes (Signed)
Immunity to [place], Mumps, and Varicella are present.   Immunity not considered present to Rubeola (measles) or Hepatitis B.   Recommend patient contact school and send vaccination report for further guidance. If school gives no guidance, recommend one additional Hepatitis B vaccine and one additional MMR vaccine then repeat titers in 6-8 weeks after doses to monitor for immunity status.   Patient notified through [place]- awaiting response.

## 2020-10-09 DIAGNOSIS — Z23 Encounter for immunization: Secondary | ICD-10-CM | POA: Diagnosis not present

## 2020-10-13 ENCOUNTER — Encounter (HOSPITAL_BASED_OUTPATIENT_CLINIC_OR_DEPARTMENT_OTHER): Payer: Self-pay

## 2020-10-13 ENCOUNTER — Ambulatory Visit (HOSPITAL_BASED_OUTPATIENT_CLINIC_OR_DEPARTMENT_OTHER): Payer: BC Managed Care – PPO

## 2020-10-13 ENCOUNTER — Telehealth (HOSPITAL_BASED_OUTPATIENT_CLINIC_OR_DEPARTMENT_OTHER): Payer: Self-pay

## 2020-10-13 NOTE — Telephone Encounter (Signed)
Sent patient a follow up MyChart message regarding Hepatitis B and MMR titers. 

## 2020-10-13 NOTE — Telephone Encounter (Signed)
-----  Message from Orma Render, NP sent at 10/05/2020 12:42 PM EDT ----- Immunity to [place], Mumps, and Varicella are present.   Immunity not considered present to Rubeola (measles) or Hepatitis B.   Recommend patient contact school and send vaccination report for further guidance. If school gives no guidance, recommend one additional Hepatitis B vaccine and one additional MMR vaccine then repeat titers in 6-8 weeks after doses to monitor for immunity status.   Patient notified through [place]- awaiting response.

## 2020-10-20 ENCOUNTER — Encounter (HOSPITAL_BASED_OUTPATIENT_CLINIC_OR_DEPARTMENT_OTHER): Payer: Self-pay | Admitting: Nurse Practitioner

## 2020-12-04 ENCOUNTER — Other Ambulatory Visit (HOSPITAL_BASED_OUTPATIENT_CLINIC_OR_DEPARTMENT_OTHER): Payer: Self-pay | Admitting: Nurse Practitioner

## 2020-12-11 ENCOUNTER — Telehealth (HOSPITAL_BASED_OUTPATIENT_CLINIC_OR_DEPARTMENT_OTHER): Payer: Self-pay

## 2020-12-11 ENCOUNTER — Other Ambulatory Visit (HOSPITAL_BASED_OUTPATIENT_CLINIC_OR_DEPARTMENT_OTHER): Payer: Self-pay | Admitting: Nurse Practitioner

## 2020-12-11 DIAGNOSIS — F909 Attention-deficit hyperactivity disorder, unspecified type: Secondary | ICD-10-CM

## 2020-12-11 MED ORDER — AMPHETAMINE-DEXTROAMPHETAMINE 20 MG PO TABS: 20.0000 mg | ORAL_TABLET | Freq: Two times a day (BID) | ORAL | 0 refills | Status: DC

## 2020-12-11 MED ORDER — AMPHETAMINE-DEXTROAMPHETAMINE 20 MG PO TABS: ORAL_TABLET | ORAL | 0 refills | Status: DC

## 2020-12-11 NOTE — Telephone Encounter (Signed)
Patient called stating the pharmacy does not have a valid prescription for his Adderall 20mg .  He would like a 3 month supply sent in to , [address] in [place].

## 2021-03-17 ENCOUNTER — Telehealth (HOSPITAL_BASED_OUTPATIENT_CLINIC_OR_DEPARTMENT_OTHER): Payer: Self-pay | Admitting: Nurse Practitioner

## 2021-03-17 NOTE — Telephone Encounter (Signed)
Pt stated he called nurse line and left a message to refill amphetamine-dextroamphetamine (ADDERALL) 20 MG tablet [[id number]]. Pt stated he contact his pharmacy and they have not refilled it yet. Pt is needing this refill today.   Pharmacy: [facility] [id number] - [place], [place] - [address]  Please advise.

## 2021-03-18 ENCOUNTER — Other Ambulatory Visit (HOSPITAL_BASED_OUTPATIENT_CLINIC_OR_DEPARTMENT_OTHER): Payer: Self-pay | Admitting: Nurse Practitioner

## 2021-03-18 DIAGNOSIS — F909 Attention-deficit hyperactivity disorder, unspecified type: Secondary | ICD-10-CM

## 2021-03-18 MED ORDER — AMPHETAMINE-DEXTROAMPHETAMINE 20 MG PO TABS: 20.0000 mg | ORAL_TABLET | Freq: Two times a day (BID) | ORAL | 0 refills | Status: DC

## 2021-03-22 ENCOUNTER — Encounter (HOSPITAL_BASED_OUTPATIENT_CLINIC_OR_DEPARTMENT_OTHER): Payer: Self-pay | Admitting: Nurse Practitioner

## 2021-03-22 ENCOUNTER — Ambulatory Visit (INDEPENDENT_AMBULATORY_CARE_PROVIDER_SITE_OTHER): Payer: BC Managed Care – PPO | Admitting: Nurse Practitioner

## 2021-03-22 ENCOUNTER — Other Ambulatory Visit: Payer: Self-pay

## 2021-03-22 VITALS — Ht 76.0 in | Wt 260.0 lb

## 2021-03-22 DIAGNOSIS — F909 Attention-deficit hyperactivity disorder, unspecified type: Secondary | ICD-10-CM | POA: Diagnosis not present

## 2021-03-22 MED ORDER — AMPHETAMINE-DEXTROAMPHETAMINE 20 MG PO TABS: ORAL_TABLET | ORAL | 0 refills | Status: DC

## 2021-03-22 MED ORDER — AMPHETAMINE-DEXTROAMPHETAMINE 20 MG PO TABS: 20.0000 mg | ORAL_TABLET | Freq: Two times a day (BID) | ORAL | 0 refills | Status: DC

## 2021-03-22 NOTE — Progress Notes (Signed)
Virtual Visit Encounter telephone visit.   I connected with  Anthony Madden on 03/22/21 at  9:30 AM EST by secure audio and/or video enabled telemedicine application. I verified that I am speaking with the correct person using two identifiers.   I introduced myself as a Publishing rights manager with the practice. The limitations of evaluation and management by telemedicine discussed with the patient and the availability of in person appointments. The patient expressed verbal understanding and consent to proceed.  Participating parties in this visit include: Myself and patient  The patient is: Patient Location: Home I am: Provider Location: Office/Clinic Subjective:    CC and HPI: Anthony Madden is a 41 y.o. year old male presenting for follow up of ADHD.  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 Difficulty sustaining attention/completing tasks: no Easily distracted: no Fidgety/Restless: no Blurts out/interrupts others: no ADHD Medication Side Effects: no    Decreased appetite: no    Headache: no    Sleeping disturbance pattern: no    Irritability: no    Rebound effects (worse than baseline) off medication: no    Anxiousness: no    Dizziness: no    Tics: no  Past medical history, Surgical history, Family history not pertinant except as noted below, Social history, Allergies, and medications have been entered into the medical record, reviewed, and corrections made.   Review of Systems:  All review of systems negative except what is listed in the HPI  Objective:    Alert and oriented x 4 Speaking in clear sentences with no shortness of breath. No distress.  Impression and Recommendations:    Problem List Items  Addressed This Visit     Attention deficit hyperactivity disorder (ADHD) - Primary   Relevant Medications   amphetamine-dextroamphetamine (ADDERALL) 20 MG tablet (Start on 04/19/2021)   amphetamine-dextroamphetamine (ADDERALL) 20 MG tablet   amphetamine-dextroamphetamine (ADDERALL) 20 MG tablet (Start on 05/17/2021)   amphetamine-dextroamphetamine (ADDERALL) 20 MG tablet (Start on 06/14/2021)    orders and follow up as documented in EMR I discussed the assessment and treatment plan with the patient. The patient was provided an opportunity to ask questions and all were answered. The patient agreed with the plan and demonstrated an understanding of the instructions.   The patient was advised to call back or seek an in-person evaluation if the symptoms worsen or if the condition fails to improve as anticipated.  Follow-Up: 4 months  I provided 16 minutes of non-face-to-face interaction with this non face-to-face encounter including intake, same-day documentation, and chart review.   Tollie Eth, NP , DNP, AGNP-c [facility] Primary Care & Sports Medicine at [facility] [phone] (fax)

## 2021-03-22 NOTE — Patient Instructions (Addendum)
Congratulations on making the deans list!!! Working full time and going to school is not easy- I am so proud of you!!  We will plan to follow-up in 4 months. We can do this as a virtual visit, if you would like.   Let me know if you have any questions or concerns in the meantime. Living With Attention Deficit Hyperactivity Disorder If you have been diagnosed with attention deficit hyperactivity disorder (ADHD), you may be relieved that you now know why you have felt or behaved a certain way. Still, you may feel overwhelmed about the treatment ahead. You may also wonder how to get the support you need and how to deal with the condition day-to-day. With treatment and support, you can live with ADHD and manage your symptoms. How to manage lifestyle changes Managing stress Stress is your body's reaction to life changes and events, both good and bad. To cope with the stress of an ADHD diagnosis, it may help to: Learn more about ADHD. Exercise regularly. Even a short daily walk can lower stress levels. Participate in training or education programs (including social skills training classes) that teach you to deal with symptoms.  Medicines Your health care provider may suggest certain medicines if he or she feels that they will help to improve your condition. Stimulant medicines are usually prescribed to treat ADHD, and therapy may also be prescribed. It is important to: Avoid using alcohol and other substances that may prevent your medicines from working properly (may interact). Talk with your pharmacist or health care provider about all the medicines that you take, their possible side effects, and what medicines are safe to take together. Make it your goal to take part in all treatment decisions (shared decision-making). Ask about possible side effects of medicines that your health care provider recommends, and tell him or her how you feel about having those side effects. It is best if shared  decision-making with your health care provider is part of your total treatment plan. Relationships To strengthen your relationships with family members while treating your condition, consider taking part in family therapy. You might also attend self-help groups alone or with a loved one. Be honest about how your symptoms affect your relationships. Make an effort to communicate respectfully instead of fighting, and find ways to show others that you care. Psychotherapy may be useful in helping you cope with how ADHD affects your relationships. How to recognize changes in your condition The following signs may mean that your treatment is working well and your condition is improving: Consistently being on time for appointments. Being more organized at home and work. Other people noticing improvements in your behavior. Achieving goals that you set for yourself. Thinking more clearly. The following signs may mean that your treatment is not working very well: Feeling impatience or more confusion. Missing, forgetting, or being late for appointments. An increasing sense of disorganization and messiness. More difficulty in reaching goals that you set for yourself. Loved ones becoming angry or frustrated with you. Follow these instructions at home: Take over-the-counter and prescription medicines only as told by your health care provider. Check with your health care provider before taking any new medicines. Create structure and an organized atmosphere at home. For example: Make a list of tasks, then rank them from most important to least important. Work on one task at a time until your listed tasks are done. Make a daily schedule and follow it consistently every day. Use an appointment calendar, and check it 2  or 3 times a day to keep on track. Keep it with you when you leave the house. Create spaces where you keep certain things, and always put things back in their places after you use them. Keep all  follow-up visits as told by your health care provider. This is important. Where to find support Talking to others  Keep emotion out of important discussions and speak in a calm, logical way. Listen closely and patiently to your loved ones. Try to understand their point of view, and try to avoid getting defensive. Take responsibility for the consequences of your actions. Ask that others do not take your behaviors personally. Aim to solve problems as they come up, and express your feelings instead of bottling them up. Talk openly about what you need from your loved ones and how they can support you. Consider going to family therapy sessions or having your family meet with a specialist who deals with ADHD-related behavior problems. Finances Not all insurance plans cover mental health care, so it is important to check with your insurance carrier. If paying for co-pays or counseling services is a problem, search for a local or county mental health care center. Public mental health care services may be offered there at a low cost or no cost when you are not able to see a private health care provider. If you are taking medicine for ADHD, you may be able to get the generic form, which may be less expensive than brand-name medicine. Some makers of prescription medicines also offer help to patients who cannot afford the medicines that they need. Questions to ask your health care provider: What are the risks and benefits of taking medicines? Would I benefit from therapy? How often should I follow up with a health care provider? Contact a health care provider if: You have side effects from your medicines, such as: Repeated muscle twitches, coughing, or speech outbursts. Sleep problems. Loss of appetite. Depression. New or worsening behavior problems. Dizziness. Unusually fast heartbeat. Stomach pains. Headaches. Get help right away if: You have a severe reaction to a medicine. Your behavior  suddenly gets worse. Summary With treatment and support, you can live with ADHD and manage your symptoms. The medicines that are most often prescribed for ADHD are stimulants. Consider taking part in family therapy or self-help groups with family members or friends. When you talk with friends and family about your ADHD, be patient and communicate openly. Take over-the-counter and prescription medicines only as told by your health care provider. Check with your health care provider before taking any new medicines. This information is not intended to replace advice given to you by your health care provider. Make sure you discuss any questions you have with your health care provider. Document Revised: 08/07/2019 Document Reviewed: 08/07/2019 Elsevier Patient Education  2022 [employer].

## 2021-07-05 ENCOUNTER — Ambulatory Visit (INDEPENDENT_AMBULATORY_CARE_PROVIDER_SITE_OTHER): Payer: BC Managed Care – PPO | Admitting: Nurse Practitioner

## 2021-07-05 ENCOUNTER — Encounter (HOSPITAL_BASED_OUTPATIENT_CLINIC_OR_DEPARTMENT_OTHER): Payer: Self-pay | Admitting: Nurse Practitioner

## 2021-07-05 DIAGNOSIS — F909 Attention-deficit hyperactivity disorder, unspecified type: Secondary | ICD-10-CM

## 2021-07-05 MED ORDER — AMPHETAMINE-DEXTROAMPHETAMINE 20 MG PO TABS: ORAL_TABLET | ORAL | 0 refills | Status: DC

## 2021-07-05 MED ORDER — AMPHETAMINE-DEXTROAMPHETAMINE 20 MG PO TABS: 20.0000 mg | ORAL_TABLET | Freq: Two times a day (BID) | ORAL | 0 refills | Status: DC

## 2021-07-05 NOTE — Patient Instructions (Signed)
I have sent 4 months refills to the pharmacy. Please let me know if you have any side effects or feel like the medication is no longer effective. We will plan to touch base before the fall semester starts to get refills for you unless you need to speak with me sooner.  ? ?Hang in there! 2 years seems like a long time, but it will fly by! You've got this!! ?

## 2021-07-05 NOTE — Assessment & Plan Note (Signed)
Chronic. ADHD well controlled at this time with no side effects of medication or concerns. He will have a heavy fall load and busy summer with working more hours, therefore we will plan to continue the medication as we have been.  ?Recommend contacting me if he feels the medication is not working as well as it has or if he begins to experience any negative side effects.  ?Will plan to follow-up in 4 months with virtual visit to check in and see how he is doing before the fall semester starts.  ?4 months refills sent to pharmacy today.  ?PDMP reviewed.  ?

## 2021-07-05 NOTE — Progress Notes (Signed)
Virtual Visit Encounter telephone visit. ? ? ?I connected with  Anthony Madden on 07/05/21 at 10:10 AM EDT by secure audio telemedicine application. I verified that I am speaking with the correct person using two identifiers. ?  ?I introduced myself as a Publishing rights manager with the practice. The limitations of evaluation and management by telemedicine discussed with the patient and the availability of in person appointments. The patient expressed verbal understanding and consent to proceed. ? ?Participating parties in this visit include: Myself and patient ? ?The patient is: Patient Location: Home ?I am: Provider Location: Office/Clinic ?Subjective:   ? ?CC and HPI: Anthony Madden is a 41 y.o. year old male presenting for follow up of ADHD. ?He reports that his semester has been very busy with the amount work due, but he feels like the medication is working well for him.  ?He denies any problems with sleeping due to the medication. He does rotate between dayshift and nightshift with work and school.  ?He denies any decreased appetite or weight loss.  ?No palpitations, nervousness, increased anxiety, or chest pain.  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 He plans to work more over the summer before starting back to school in the fall. He will have 2 more years before graduation. His fall semester is fairly heavy with intense work.  ?He feels like the current dosage of medication is effective and he would like to stay on this.  ?He is taking the medication daily as he is either in school or working nearly every day.  ?He has been monitoring his BP and it is typically in the 120's systolic with no alarm symptoms.  ? ? ?Past medical history, Surgical history, Family history not pertinant except as noted below, Social history, Allergies, and medications have been entered into the medical record, reviewed, and corrections made.  ? ?Review of Systems:  ?All review of systems negative except what is  listed in the HPI ? ?Objective:   ? ?Alert and oriented x 4 ?Speaking in clear sentences with no shortness of breath. ?No distress. ? ?Impression and Recommendations:   ? ?Problem List Items Addressed This Visit   ? ? Attention deficit hyperactivity disorder (ADHD)  ?  Chronic. ADHD well controlled at this time with no side effects of medication or concerns. He will have a heavy fall load and busy summer with working more hours, therefore we will plan to continue the medication as we have been.  ?Recommend contacting me if he feels the medication is not working as well as it has or if he begins to experience any negative side effects.  ?Will plan to follow-up in 4 months with virtual visit to check in and see how he is doing before the fall semester starts.  ?4 months refills sent to pharmacy today.  ?PDMP reviewed.  ? ?  ?  ? Relevant Medications  ? amphetamine-dextroamphetamine (ADDERALL) 20 MG tablet  ? amphetamine-dextroamphetamine (ADDERALL) 20 MG tablet (Start on 08/02/2021)  ? amphetamine-dextroamphetamine (ADDERALL) 20 MG tablet (Start on 08/30/2021)  ? amphetamine-dextroamphetamine (ADDERALL) 20 MG tablet (Start on 09/27/2021)  ? ? ?current treatment plan is effective, no change in therapy ?I discussed the assessment and treatment plan with the patient. The patient was provided an opportunity to ask questions and all were answered. The patient agreed with the plan and demonstrated an understanding of the instructions. ?  ?The patient was advised to call back or seek an in-person evaluation if the  symptoms worsen or if the condition fails to improve as anticipated. ? ?Follow-Up: 4 months virtual ADHD check-in ? ?I provided 9 minutes of non-face-to-face interaction with this non face-to-face encounter including intake, same-day documentation, and chart review.  ? ?Tollie Eth, NP , DNP, AGNP-c ?[facility] ?Primary Care & Sports Medicine at [facility] ?[phone] ?[phone] (fax) ? ?

## 2021-08-09 ENCOUNTER — Encounter (HOSPITAL_BASED_OUTPATIENT_CLINIC_OR_DEPARTMENT_OTHER): Payer: Self-pay | Admitting: Nurse Practitioner

## 2021-08-10 ENCOUNTER — Other Ambulatory Visit (HOSPITAL_BASED_OUTPATIENT_CLINIC_OR_DEPARTMENT_OTHER): Payer: Self-pay | Admitting: Nurse Practitioner

## 2021-08-10 DIAGNOSIS — Z111 Encounter for screening for respiratory tuberculosis: Secondary | ICD-10-CM

## 2021-08-31 ENCOUNTER — Ambulatory Visit (HOSPITAL_BASED_OUTPATIENT_CLINIC_OR_DEPARTMENT_OTHER): Payer: Managed Care, Other (non HMO)

## 2021-08-31 ENCOUNTER — Ambulatory Visit (HOSPITAL_BASED_OUTPATIENT_CLINIC_OR_DEPARTMENT_OTHER): Payer: Self-pay

## 2021-08-31 DIAGNOSIS — Z111 Encounter for screening for respiratory tuberculosis: Secondary | ICD-10-CM

## 2021-09-04 LAB — QUANTIFERON-TB GOLD PLUS
QuantiFERON Mitogen Value: >10 IU/mL
QuantiFERON Nil Value: 0.04 IU/mL
QuantiFERON TB1 Ag Value: 0.04 IU/mL
QuantiFERON TB2 Ag Value: 0.04 IU/mL
QuantiFERON-TB Gold Plus: NEGATIVE

## 2021-11-16 ENCOUNTER — Other Ambulatory Visit (HOSPITAL_BASED_OUTPATIENT_CLINIC_OR_DEPARTMENT_OTHER): Payer: Self-pay | Admitting: Nurse Practitioner

## 2021-11-16 DIAGNOSIS — F909 Attention-deficit hyperactivity disorder, unspecified type: Secondary | ICD-10-CM

## 2021-11-17 ENCOUNTER — Encounter (HOSPITAL_BASED_OUTPATIENT_CLINIC_OR_DEPARTMENT_OTHER): Payer: Self-pay | Admitting: Nurse Practitioner

## 2021-11-17 MED ORDER — AMPHETAMINE-DEXTROAMPHETAMINE 20 MG PO TABS: 20.0000 mg | ORAL_TABLET | Freq: Two times a day (BID) | ORAL | 0 refills | Status: DC

## 2021-11-18 ENCOUNTER — Telehealth (HOSPITAL_BASED_OUTPATIENT_CLINIC_OR_DEPARTMENT_OTHER): Payer: Self-pay | Admitting: Nurse Practitioner

## 2021-11-18 NOTE — Telephone Encounter (Signed)
Pt is calling to advise he is out of his medication --Please call

## 2021-11-19 ENCOUNTER — Other Ambulatory Visit (HOSPITAL_BASED_OUTPATIENT_CLINIC_OR_DEPARTMENT_OTHER): Payer: Self-pay | Admitting: Nurse Practitioner

## 2021-11-19 ENCOUNTER — Telehealth (HOSPITAL_BASED_OUTPATIENT_CLINIC_OR_DEPARTMENT_OTHER): Payer: Self-pay

## 2021-11-19 DIAGNOSIS — F909 Attention-deficit hyperactivity disorder, unspecified type: Secondary | ICD-10-CM

## 2021-11-19 MED ORDER — AMPHETAMINE-DEXTROAMPHETAMINE 20 MG PO TABS: 20.0000 mg | ORAL_TABLET | Freq: Two times a day (BID) | ORAL | 0 refills | Status: DC

## 2021-11-19 MED ORDER — AMPHETAMINE-DEXTROAMPHETAMINE 20 MG PO TABS: ORAL_TABLET | ORAL | 0 refills | Status: DC

## 2021-11-19 NOTE — Telephone Encounter (Signed)
Patient called and needs refill of adderall sent to the pharmacy. Please advise.

## 2021-12-29 ENCOUNTER — Encounter (HOSPITAL_BASED_OUTPATIENT_CLINIC_OR_DEPARTMENT_OTHER): Payer: Self-pay | Admitting: Nurse Practitioner

## 2021-12-29 ENCOUNTER — Telehealth (HOSPITAL_BASED_OUTPATIENT_CLINIC_OR_DEPARTMENT_OTHER): Payer: Self-pay

## 2021-12-29 NOTE — Telephone Encounter (Signed)
Pt sent in Wellness Screening Form from [employer] to be completed this was placed in SB box to be completed.

## 2022-03-01 ENCOUNTER — Telehealth: Payer: Managed Care, Other (non HMO) | Admitting: Family Medicine

## 2022-03-01 DIAGNOSIS — L03114 Cellulitis of left upper limb: Secondary | ICD-10-CM | POA: Diagnosis not present

## 2022-03-01 MED ORDER — SULFAMETHOXAZOLE-TRIMETHOPRIM 800-160 MG PO TABS: 1.0000 | ORAL_TABLET | Freq: Two times a day (BID) | ORAL | 0 refills | Status: AC

## 2022-03-01 NOTE — Progress Notes (Signed)
E Visit for Cellulitis  We are sorry that you are not feeling well. Here is how we plan to help!  Based on what you shared with me it looks like you have cellulitis.  Cellulitis looks like areas of skin redness, swelling, and warmth; it develops as a result of bacteria entering under the skin. Little red spots and/or bleeding can be seen in skin, and tiny surface sacs containing fluid can occur. Fever can be present. Cellulitis is almost always on one side of a body, and the lower limbs are the most common site of involvement.   I have prescribed:  Bactrim DS 1 tablet by mouth twice a day for 7 days  HOME CARE:  Take your medications as ordered and take all of them, even if the skin irritation appears to be healing.   GET HELP RIGHT AWAY IF:  Symptoms that don't begin to go away within 48 hours. Severe redness persists or worsens If the area turns color, spreads or swells. If it blisters and opens, develops yellow-brown crust or bleeds. You develop a fever or chills. If the pain increases or becomes unbearable.  Are unable to keep fluids and food down.  MAKE SURE YOU   Understand these instructions. Will watch your condition. Will get help right away if you are not doing well or get worse.  Thank you for choosing an e-visit.  Your e-visit answers were reviewed by a board certified advanced clinical practitioner to complete your personal care plan. Depending upon the condition, your plan could have included both over the counter or prescription medications.  Please review your pharmacy choice. Make sure the pharmacy is open so you can pick up prescription now. If there is a problem, you may contact your provider through MyChart messaging and have the prescription routed to another pharmacy.  Your safety is important to us. If you have drug allergies check your prescription carefully.   For the next 24 hours you can use MyChart to ask questions about today's visit, request a  non-urgent call back, or ask for a work or school excuse. You will get an email in the next two days asking about your experience. I hope that your e-visit has been valuable and will speed your recovery.  I provided 5 minutes of non face-to-face time during this encounter for chart review, medication and order placement, as well as and documentation.   

## 2022-03-15 ENCOUNTER — Encounter: Payer: Self-pay | Admitting: Internal Medicine

## 2022-03-17 ENCOUNTER — Encounter: Payer: Self-pay | Admitting: Nurse Practitioner

## 2022-03-17 ENCOUNTER — Other Ambulatory Visit (HOSPITAL_BASED_OUTPATIENT_CLINIC_OR_DEPARTMENT_OTHER): Payer: Self-pay | Admitting: Nurse Practitioner

## 2022-03-17 DIAGNOSIS — F909 Attention-deficit hyperactivity disorder, unspecified type: Secondary | ICD-10-CM

## 2022-03-18 ENCOUNTER — Telehealth: Payer: Managed Care, Other (non HMO) | Admitting: Nurse Practitioner

## 2022-03-18 ENCOUNTER — Encounter: Payer: Self-pay | Admitting: Nurse Practitioner

## 2022-03-18 DIAGNOSIS — F909 Attention-deficit hyperactivity disorder, unspecified type: Secondary | ICD-10-CM | POA: Diagnosis not present

## 2022-03-18 MED ORDER — AMPHETAMINE-DEXTROAMPHETAMINE 20 MG PO TABS: 20.0000 mg | ORAL_TABLET | Freq: Two times a day (BID) | ORAL | 0 refills | Status: DC

## 2022-03-18 MED ORDER — AMPHETAMINE-DEXTROAMPHETAMINE 20 MG PO TABS: ORAL_TABLET | ORAL | 0 refills | Status: DC

## 2022-03-18 NOTE — Assessment & Plan Note (Signed)
Anthony Madden is doing great on the current dose of Adderall with no alarm symptoms or concerns present. I have reviewed PDMP today. Will send in 6 months supply of medication for him to the pharmacy given his stability on the current dose and lack of negative side effects. I recommend that he schedule a follow-up if he has new symptoms or concerns. Otherwise, we will plan to follow-up in 6 months.

## 2022-03-18 NOTE — Progress Notes (Signed)
Virtual Visit Encounter mychart visit.   I connected with  Anthony Madden on 03/18/22 at  1:30 PM EST by secure video and audio telemedicine application. I verified that I am speaking with the correct person using two identifiers.   I introduced myself as a Designer, jewellery with the practice. The limitations of evaluation and management by telemedicine discussed with the patient and the availability of in person appointments. The patient expressed verbal understanding and consent to proceed.  Participating parties in this visit include: Myself and patient  The patient is: Patient Location: Home I am: Provider Location: Office/Clinic Subjective:    CC and HPI: Anthony Madden is a 42 y.o. year old male presenting for follow up of ADHD. Patient reports the following: ADHD - he tells me that he is doing great with the medication. School is going very well and the medication is helping with his focus and attention. He is not having any negative side effects from the medication. He tells me he is sleeping well (aside from alternating sleep patterns with work), he is not having palpitations or chest pain, his appetite is good, and he is not having anxiety. He does not feel the dosage needs to be changed at this time. He is not taking other controlled substances, stimulants, or depressive medications.    Past medical history, Surgical history, Family history not pertinant except as noted below, Social history, Allergies, and medications have been entered into the medical record, reviewed, and corrections made.   Review of Systems:  All review of systems negative except what is listed in the HPI  Objective:    Alert and oriented x 4 Speaking in clear sentences with no shortness of breath. No distress.  Impression and Recommendations:    Problem List Items Addressed This Visit     Attention deficit hyperactivity disorder (ADHD)    Anthony Madden is doing great on the current dose of Adderall with no  alarm symptoms or concerns present. I have reviewed PDMP today. Will send in 6 months supply of medication for him to the pharmacy given his stability on the current dose and lack of negative side effects. I recommend that he schedule a follow-up if he has new symptoms or concerns. Otherwise, we will plan to follow-up in 6 months.       Relevant Medications   amphetamine-dextroamphetamine (ADDERALL) 20 MG tablet   amphetamine-dextroamphetamine (ADDERALL) 20 MG tablet (Start on 06/10/2022)   amphetamine-dextroamphetamine (ADDERALL) 20 MG tablet (Start on 05/13/2022)   amphetamine-dextroamphetamine (ADDERALL) 20 MG tablet (Start on 04/15/2022)   amphetamine-dextroamphetamine (ADDERALL) 20 MG tablet (Start on 07/08/2022)   amphetamine-dextroamphetamine (ADDERALL) 20 MG tablet (Start on 08/05/2022)    orders and follow up as documented in EMR I discussed the assessment and treatment plan with the patient. The patient was provided an opportunity to ask questions and all were answered. The patient agreed with the plan and demonstrated an understanding of the instructions.   The patient was advised to call back or seek an in-person evaluation if the symptoms worsen or if the condition fails to improve as anticipated.  Follow-Up: in 6 months  I provided 13 minutes of non-face-to-face interaction with this non face-to-face encounter including intake, same-day documentation, and chart review.   Orma Render, NP , DNP, AGNP-c [facility]

## 2022-06-02 ENCOUNTER — Encounter: Payer: Self-pay | Admitting: Nurse Practitioner

## 2022-09-12 ENCOUNTER — Other Ambulatory Visit: Payer: Self-pay | Admitting: Nurse Practitioner

## 2022-09-12 DIAGNOSIS — F909 Attention-deficit hyperactivity disorder, unspecified type: Secondary | ICD-10-CM

## 2022-09-12 NOTE — Telephone Encounter (Signed)
Refill request last apt 03/18/22. 

## 2022-09-13 ENCOUNTER — Encounter: Payer: Self-pay | Admitting: Nurse Practitioner

## 2022-09-13 MED ORDER — AMPHETAMINE-DEXTROAMPHETAMINE 20 MG PO TABS: 20.0000 mg | ORAL_TABLET | Freq: Two times a day (BID) | ORAL | 0 refills | Status: DC

## 2022-10-04 ENCOUNTER — Telehealth: Payer: Managed Care, Other (non HMO) | Admitting: Nurse Practitioner

## 2022-10-17 ENCOUNTER — Encounter: Payer: Self-pay | Admitting: Nurse Practitioner

## 2022-10-17 DIAGNOSIS — F909 Attention-deficit hyperactivity disorder, unspecified type: Secondary | ICD-10-CM

## 2022-10-17 MED ORDER — AMPHETAMINE-DEXTROAMPHETAMINE 20 MG PO TABS: ORAL_TABLET | ORAL | 0 refills | Status: DC

## 2022-10-17 MED ORDER — AMPHETAMINE-DEXTROAMPHETAMINE 20 MG PO TABS
20.0000 mg | ORAL_TABLET | Freq: Two times a day (BID) | ORAL | 0 refills | Status: DC
Start: 2022-10-17 — End: 2022-11-25

## 2022-10-17 MED ORDER — AMPHETAMINE-DEXTROAMPHETAMINE 20 MG PO TABS
20.0000 mg | ORAL_TABLET | Freq: Two times a day (BID) | ORAL | 0 refills | Status: DC
Start: 2022-11-14 — End: 2022-11-25

## 2022-10-18 ENCOUNTER — Ambulatory Visit: Payer: Managed Care, Other (non HMO) | Admitting: Nurse Practitioner

## 2022-10-18 ENCOUNTER — Encounter: Payer: Self-pay | Admitting: Nurse Practitioner

## 2022-10-18 VITALS — BP 124/82 | HR 107 | Wt 263.8 lb

## 2022-10-18 DIAGNOSIS — R6882 Decreased libido: Secondary | ICD-10-CM

## 2022-10-18 DIAGNOSIS — Z020 Encounter for examination for admission to educational institution: Secondary | ICD-10-CM | POA: Diagnosis not present

## 2022-10-18 DIAGNOSIS — F900 Attention-deficit hyperactivity disorder, predominantly inattentive type: Secondary | ICD-10-CM

## 2022-10-18 DIAGNOSIS — M62838 Other muscle spasm: Secondary | ICD-10-CM

## 2022-10-18 DIAGNOSIS — M25511 Pain in right shoulder: Secondary | ICD-10-CM

## 2022-10-18 NOTE — Progress Notes (Signed)
Anthony Eth, DNP, AGNP-c [facility] [address] [place], [place] [address] [phone]   ACUTE VISIT- ESTABLISHED PATIENT  Blood pressure 124/82, pulse (!) 107, weight 263 lb 12.8 oz (119.7 kg).  Subjective:  HPI Anthony Madden is a 42 y.o. male presents to day for evaluation of: low energy and study abroad  Anthony Madden has concerns today with low energy and decreased sex drive for the past several months. He also reports difficulty with building muscle mass as he would expect with his level of activity. He is concerned for possible low testosterone and would like to have this monitored today.   He also reports increased stress related to school with tension in his neck and upper back. He would like to know if a prescription for massage could be written so that he can have this done as part of his medical treatment. He also has pain in his right shoulder from recent pickle ball. He reports soreness, but no specific injury, swelling, or ecchymosis.   He will be leaving in [place] January to study in [place] and will be gone for several weeks. He will need to get his adderall a little [place] to ensure that he has enough for the trip. His medication is working well for him at this time and he has no concerns with decreased hunger, weight loss, palpitations, or difficulty sleeping.    He also reports two skn tags that he would like to have looked at today.  PMH, Medications, and Allergies reviewed and updated in chart as appropriate.   ROS negative except for what is listed in HPI. Objective:  Physical Exam Vitals and nursing note reviewed.  Constitutional:      General: He is not in acute distress.    Appearance: Normal appearance.  Eyes:     Conjunctiva/sclera: Conjunctivae normal.  Neck:     Vascular: No carotid bruit.  Cardiovascular:     Rate and Rhythm: Regular rhythm. Tachycardia present.     Pulses: Normal pulses.     Heart sounds: Normal heart sounds.  Pulmonary:      Effort: Pulmonary effort is normal.     Breath sounds: Normal breath sounds.  Abdominal:     General: Bowel sounds are normal.     Palpations: Abdomen is soft.  Musculoskeletal:        General: Tenderness present.     Right shoulder: Tenderness present. Decreased range of motion.     Left shoulder: Tenderness present.     Right lower leg: No edema.     Left lower leg: No edema.     Comments: Spasms and tension noted in cervical spine and trapezius musculature.   Skin:    General: Skin is warm and dry.     Capillary Refill: Capillary refill takes less than 2 seconds.  Neurological:     General: No focal deficit present.     Mental Status: He is alert and oriented to person, place, and time.  Psychiatric:        Mood and Affect: Mood normal.        Behavior: Behavior normal.         Assessment & Plan:   Problem List Items Addressed This Visit     Attention deficit hyperactivity disorder (ADHD)    Chronic. Well managed on adderall 20mg  with no concerning symptoms.  Plan: - Continue current regimen - Will send 90 day scripts for September-December and again for January-March for travel.  Acute pain of right shoulder - Primary    Right shoulder pain related to increased activity with pickle ball. No alarm symptoms are present at this time.  Plan: - will send for PT for shoulder and neck      Relevant Orders   Ambulatory referral to Physical Therapy   Low libido    Symptoms consistent with decreased testosterone. We will monitor today. Patient aware that we will need two measurements to ensure proper diagnosis.  Plan: - monitor testosterone.       Relevant Orders   Testosterone, Free, Total, SHBG (Completed)   Muscle spasms of neck    Chronic spasms in the neck and upper back. Recommend PT to include massage and possibly dry needling to help relive tension and pain.  Plan: - Referral to PT      Other Visit Diagnoses     School health examination        Relevant Orders   QuantiFERON-TB Gold Plus (Completed)      Patient will schedule follow-up for removal of skin tags   Time: 34 minutes, >50% spent counseling, care coordination, chart review, and documentation.    Anthony Eth, DNP, AGNP-c   History, Medications, Surgery, SDOH, and Family History reviewed and updated as appropriate.

## 2022-10-18 NOTE — Patient Instructions (Addendum)
I have sent the referral for PT for you. I asked them about massage and stretching for the shoulder.   I will let you know what your testosterone levels are.   We will send a three month script in January for your ADHD medication.

## 2022-10-21 ENCOUNTER — Encounter: Payer: Self-pay | Admitting: Nurse Practitioner

## 2022-10-30 ENCOUNTER — Encounter: Payer: Self-pay | Admitting: Nurse Practitioner

## 2022-10-30 DIAGNOSIS — R6882 Decreased libido: Secondary | ICD-10-CM | POA: Insufficient documentation

## 2022-10-30 DIAGNOSIS — M62838 Other muscle spasm: Secondary | ICD-10-CM | POA: Insufficient documentation

## 2022-10-30 HISTORY — DX: Other muscle spasm: M62.838

## 2022-10-30 NOTE — Assessment & Plan Note (Signed)
Right shoulder pain related to increased activity with pickle ball. No alarm symptoms are present at this time.  Plan: - will send for PT for shoulder and neck

## 2022-10-30 NOTE — Assessment & Plan Note (Signed)
Symptoms consistent with decreased testosterone. We will monitor today. Patient aware that we will need two measurements to ensure proper diagnosis.  Plan: - monitor testosterone.

## 2022-10-30 NOTE — Assessment & Plan Note (Signed)
Chronic. Well managed on adderall 20mg  with no concerning symptoms.  Plan: - Continue current regimen - Will send 90 day scripts for September-December and again for January-March for travel.

## 2022-10-30 NOTE — Assessment & Plan Note (Signed)
Chronic spasms in the neck and upper back. Recommend PT to include massage and possibly dry needling to help relive tension and pain.  Plan: - Referral to PT

## 2022-11-01 ENCOUNTER — Other Ambulatory Visit: Payer: Self-pay

## 2022-11-01 DIAGNOSIS — R6882 Decreased libido: Secondary | ICD-10-CM

## 2022-11-08 ENCOUNTER — Other Ambulatory Visit: Payer: Managed Care, Other (non HMO)

## 2022-11-08 DIAGNOSIS — R6882 Decreased libido: Secondary | ICD-10-CM

## 2022-11-08 DIAGNOSIS — E349 Endocrine disorder, unspecified: Secondary | ICD-10-CM

## 2022-11-08 DIAGNOSIS — Z114 Encounter for screening for human immunodeficiency virus [HIV]: Secondary | ICD-10-CM

## 2022-11-08 DIAGNOSIS — Z1159 Encounter for screening for other viral diseases: Secondary | ICD-10-CM

## 2022-11-09 LAB — TESTOSTERONE,FREE AND TOTAL
Testosterone, Free: 4 pg/mL — ABNORMAL LOW (ref 6.8–21.5)
Testosterone: 457 ng/dL (ref 264–916)

## 2022-11-14 ENCOUNTER — Encounter: Payer: Self-pay | Admitting: Nurse Practitioner

## 2022-11-14 DIAGNOSIS — E8809 Other disorders of plasma-protein metabolism, not elsewhere classified: Secondary | ICD-10-CM | POA: Insufficient documentation

## 2022-11-25 ENCOUNTER — Encounter: Payer: Self-pay | Admitting: Nurse Practitioner

## 2022-11-25 ENCOUNTER — Telehealth: Payer: Self-pay

## 2022-11-25 ENCOUNTER — Telehealth: Payer: Self-pay | Admitting: Nurse Practitioner

## 2022-11-25 ENCOUNTER — Ambulatory Visit (INDEPENDENT_AMBULATORY_CARE_PROVIDER_SITE_OTHER): Payer: Managed Care, Other (non HMO) | Admitting: Nurse Practitioner

## 2022-11-25 VITALS — BP 124/80 | HR 80 | Ht 75.25 in | Wt 256.0 lb

## 2022-11-25 DIAGNOSIS — L918 Other hypertrophic disorders of the skin: Secondary | ICD-10-CM | POA: Diagnosis not present

## 2022-11-25 DIAGNOSIS — F909 Attention-deficit hyperactivity disorder, unspecified type: Secondary | ICD-10-CM | POA: Diagnosis not present

## 2022-11-25 DIAGNOSIS — J0140 Acute pansinusitis, unspecified: Secondary | ICD-10-CM | POA: Diagnosis not present

## 2022-11-25 DIAGNOSIS — E349 Endocrine disorder, unspecified: Secondary | ICD-10-CM

## 2022-11-25 DIAGNOSIS — T63481A Toxic effect of venom of other arthropod, accidental (unintentional), initial encounter: Secondary | ICD-10-CM

## 2022-11-25 DIAGNOSIS — Z23 Encounter for immunization: Secondary | ICD-10-CM

## 2022-11-25 DIAGNOSIS — Z6831 Body mass index (BMI) 31.0-31.9, adult: Secondary | ICD-10-CM

## 2022-11-25 DIAGNOSIS — Z0000 Encounter for general adult medical examination without abnormal findings: Secondary | ICD-10-CM

## 2022-11-25 HISTORY — DX: Acute pansinusitis, unspecified: J01.40

## 2022-11-25 MED ORDER — AMPHETAMINE-DEXTROAMPHETAMINE 20 MG PO TABS
ORAL_TABLET | ORAL | 0 refills | Status: DC
Start: 2023-01-20 — End: 2023-02-16

## 2022-11-25 MED ORDER — SYRINGE/NEEDLE (DISP) 27G X 1/2" 1 ML MISC
1 refills | Status: DC
Start: 2022-11-25 — End: 2022-11-25

## 2022-11-25 MED ORDER — AMPHETAMINE-DEXTROAMPHETAMINE 20 MG PO TABS
20.0000 mg | ORAL_TABLET | Freq: Two times a day (BID) | ORAL | 0 refills | Status: DC
Start: 2023-04-14 — End: 2023-06-05

## 2022-11-25 MED ORDER — AMPHETAMINE-DEXTROAMPHETAMINE 20 MG PO TABS: 20.0000 mg | ORAL_TABLET | Freq: Two times a day (BID) | ORAL | 0 refills | Status: DC

## 2022-11-25 MED ORDER — PREDNISONE 20 MG PO TABS: 40.0000 mg | ORAL_TABLET | Freq: Every day | ORAL | 0 refills | Status: DC

## 2022-11-25 MED ORDER — AMOXICILLIN-POT CLAVULANATE 875-125 MG PO TABS
1.0000 | ORAL_TABLET | Freq: Two times a day (BID) | ORAL | 0 refills | Status: DC
Start: 2022-11-25 — End: 2023-11-03

## 2022-11-25 MED ORDER — SYRINGE/NEEDLE (DISP) 27G X 1/2" 1 ML MISC
1 refills | Status: AC
Start: 2022-11-25 — End: ?

## 2022-11-25 MED ORDER — TESTOSTERONE CYPIONATE 200 MG/ML IM SOLN: 100.0000 mg | INTRAMUSCULAR | 1 refills | Status: DC

## 2022-11-25 MED ORDER — EPINEPHRINE 0.3 MG/0.3ML IJ SOAJ: 0.3000 mg | INTRAMUSCULAR | 4 refills | Status: AC | PRN

## 2022-11-25 MED ORDER — TESTOSTERONE CYPIONATE 100 MG/ML IM SOLN
100.0000 mg | INTRAMUSCULAR | 1 refills | Status: DC
Start: 2022-11-25 — End: 2022-11-25

## 2022-11-25 NOTE — Assessment & Plan Note (Signed)
Symptoms started on Sunday with fever on Monday. Nasal congestion with green-yellow drainage, sinus pressure, and chest congestion. No fever since Monday. Left lung has crackles on auscultation. Tenderness present to sinuses  -Start Augmentin for sinusitis. -Consider starting steroids on Monday if no improvement in cough and fullness feeling. -Take Mucinex DM to help clear mucus from lungs. -Notify provider if symptoms worsen or do not improve.

## 2022-11-25 NOTE — Assessment & Plan Note (Signed)
Chronic. Well controlled on adderall BID. Refills provided

## 2022-11-25 NOTE — Assessment & Plan Note (Signed)

## 2022-11-25 NOTE — Assessment & Plan Note (Signed)
Refill on epi pen

## 2022-11-25 NOTE — Telephone Encounter (Signed)
[facility] called and says they also only have 200mg  testosterone available if you can send in a new script and also for the syringes it can't say "subcutaneous" because it is being injected in the muscle.

## 2022-11-25 NOTE — Telephone Encounter (Signed)
Pharmacy states 6 adderall rx are over the legal limit. They are cancelling the rx in Jan and Feb. States a pt can only have 3 on file at a time.

## 2022-11-25 NOTE — Assessment & Plan Note (Signed)
Low free testosterone level despite normal total testosterone level. -Start testosterone injections 100mg  every 14 days. - Recheck levels in 3 months

## 2022-11-26 LAB — CBC WITH DIFFERENTIAL/PLATELET
Basophils Absolute: 0 10*3/uL (ref 0.0–0.2)
Basos: 1 %
EOS (ABSOLUTE): 0.2 10*3/uL (ref 0.0–0.4)
Eos: 4 %
Hematocrit: 44.7 % (ref 37.5–51.0)
Hemoglobin: 14.6 g/dL (ref 13.0–17.7)
Immature Grans (Abs): 0 10*3/uL (ref 0.0–0.1)
Immature Granulocytes: 0 %
Lymphocytes Absolute: 1.6 10*3/uL (ref 0.7–3.1)
Lymphs: 35 %
MCH: 28.6 pg (ref 26.6–33.0)
MCHC: 32.7 g/dL (ref 31.5–35.7)
MCV: 88 fL (ref 79–97)
Monocytes Absolute: 0.3 10*3/uL (ref 0.1–0.9)
Monocytes: 6 %
Neutrophils Absolute: 2.4 10*3/uL (ref 1.4–7.0)
Neutrophils: 54 %
Platelets: 260 10*3/uL (ref 150–450)
RBC: 5.11 x10E6/uL (ref 4.14–5.80)
RDW: 12.9 % (ref 11.6–15.4)
WBC: 4.5 10*3/uL (ref 3.4–10.8)

## 2022-11-26 LAB — CMP14+EGFR
ALT: 38 IU/L (ref 0–44)
AST: 25 IU/L (ref 0–40)
Albumin: 4.6 g/dL (ref 4.1–5.1)
Alkaline Phosphatase: 71 IU/L (ref 44–121)
BUN/Creatinine Ratio: 10 (ref 9–20)
BUN: 12 mg/dL (ref 6–24)
Bilirubin Total: 0.5 mg/dL (ref 0.0–1.2)
CO2: 23 mmol/L (ref 20–29)
Calcium: 9.6 mg/dL (ref 8.7–10.2)
Chloride: 101 mmol/L (ref 96–106)
Creatinine, Ser: 1.16 mg/dL (ref 0.76–1.27)
Globulin, Total: 2.6 g/dL (ref 1.5–4.5)
Glucose: 96 mg/dL (ref 70–99)
Potassium: 4.6 mmol/L (ref 3.5–5.2)
Sodium: 140 mmol/L (ref 134–144)
Total Protein: 7.2 g/dL (ref 6.0–8.5)
eGFR: 80 mL/min/{1.73_m2} (ref >59)

## 2022-11-26 LAB — HEMOGLOBIN A1C
Est. average glucose Bld gHb Est-mCnc: 120 mg/dL
Hgb A1c MFr Bld: 5.8 % — ABNORMAL HIGH (ref 4.8–5.6)

## 2022-11-26 LAB — LIPID PANEL
Chol/HDL Ratio: 2.9 ratio (ref 0.0–5.0)
Cholesterol, Total: 138 mg/dL (ref 100–199)
HDL: 47 mg/dL (ref >39)
LDL Chol Calc (NIH): 72 mg/dL (ref 0–99)
Triglycerides: 101 mg/dL (ref 0–149)
VLDL Cholesterol Cal: 19 mg/dL (ref 5–40)

## 2022-12-02 DIAGNOSIS — L918 Other hypertrophic disorders of the skin: Secondary | ICD-10-CM | POA: Insufficient documentation

## 2022-12-02 NOTE — Assessment & Plan Note (Addendum)
Skin tag of posterior thoracic region and right medial thigh, proximal to the groin. Both areas causing pain due to repeated catching, pulling, and/or tearing when caught on clothing.  Verbal consent obtained and areas cleansed with betadine. Both areas infiltrated at the base of the lesion with 3mL of 1% lidocaine with epi buffered with NaHCO3 at a 3:1 ratio. With sterile technique, utilizing a #11 blade, both skin tags removed. Hemostasis achieved with pressure and silver nitrate to each area. Dressings applied and instructions provided. Patient encouraged to follow-up if any signs of infection presented.  Patient tolerated procedures well with no adverse reaction.

## 2023-01-03 ENCOUNTER — Encounter: Payer: Self-pay | Admitting: Nurse Practitioner

## 2023-02-16 ENCOUNTER — Encounter: Payer: Self-pay | Admitting: Nurse Practitioner

## 2023-02-16 DIAGNOSIS — F909 Attention-deficit hyperactivity disorder, unspecified type: Secondary | ICD-10-CM

## 2023-02-16 MED ORDER — AMPHETAMINE-DEXTROAMPHETAMINE 20 MG PO TABS
ORAL_TABLET | ORAL | 0 refills | Status: DC
Start: 2023-02-16 — End: 2023-06-05

## 2023-04-13 IMAGING — DX DG CHEST 2V
2 series · 2 of 2 positions shown · non-contrast
Comparison: None.

CLINICAL DATA: Positive PPD test.

EXAM:
CHEST - 2 VIEW

[chest pa]
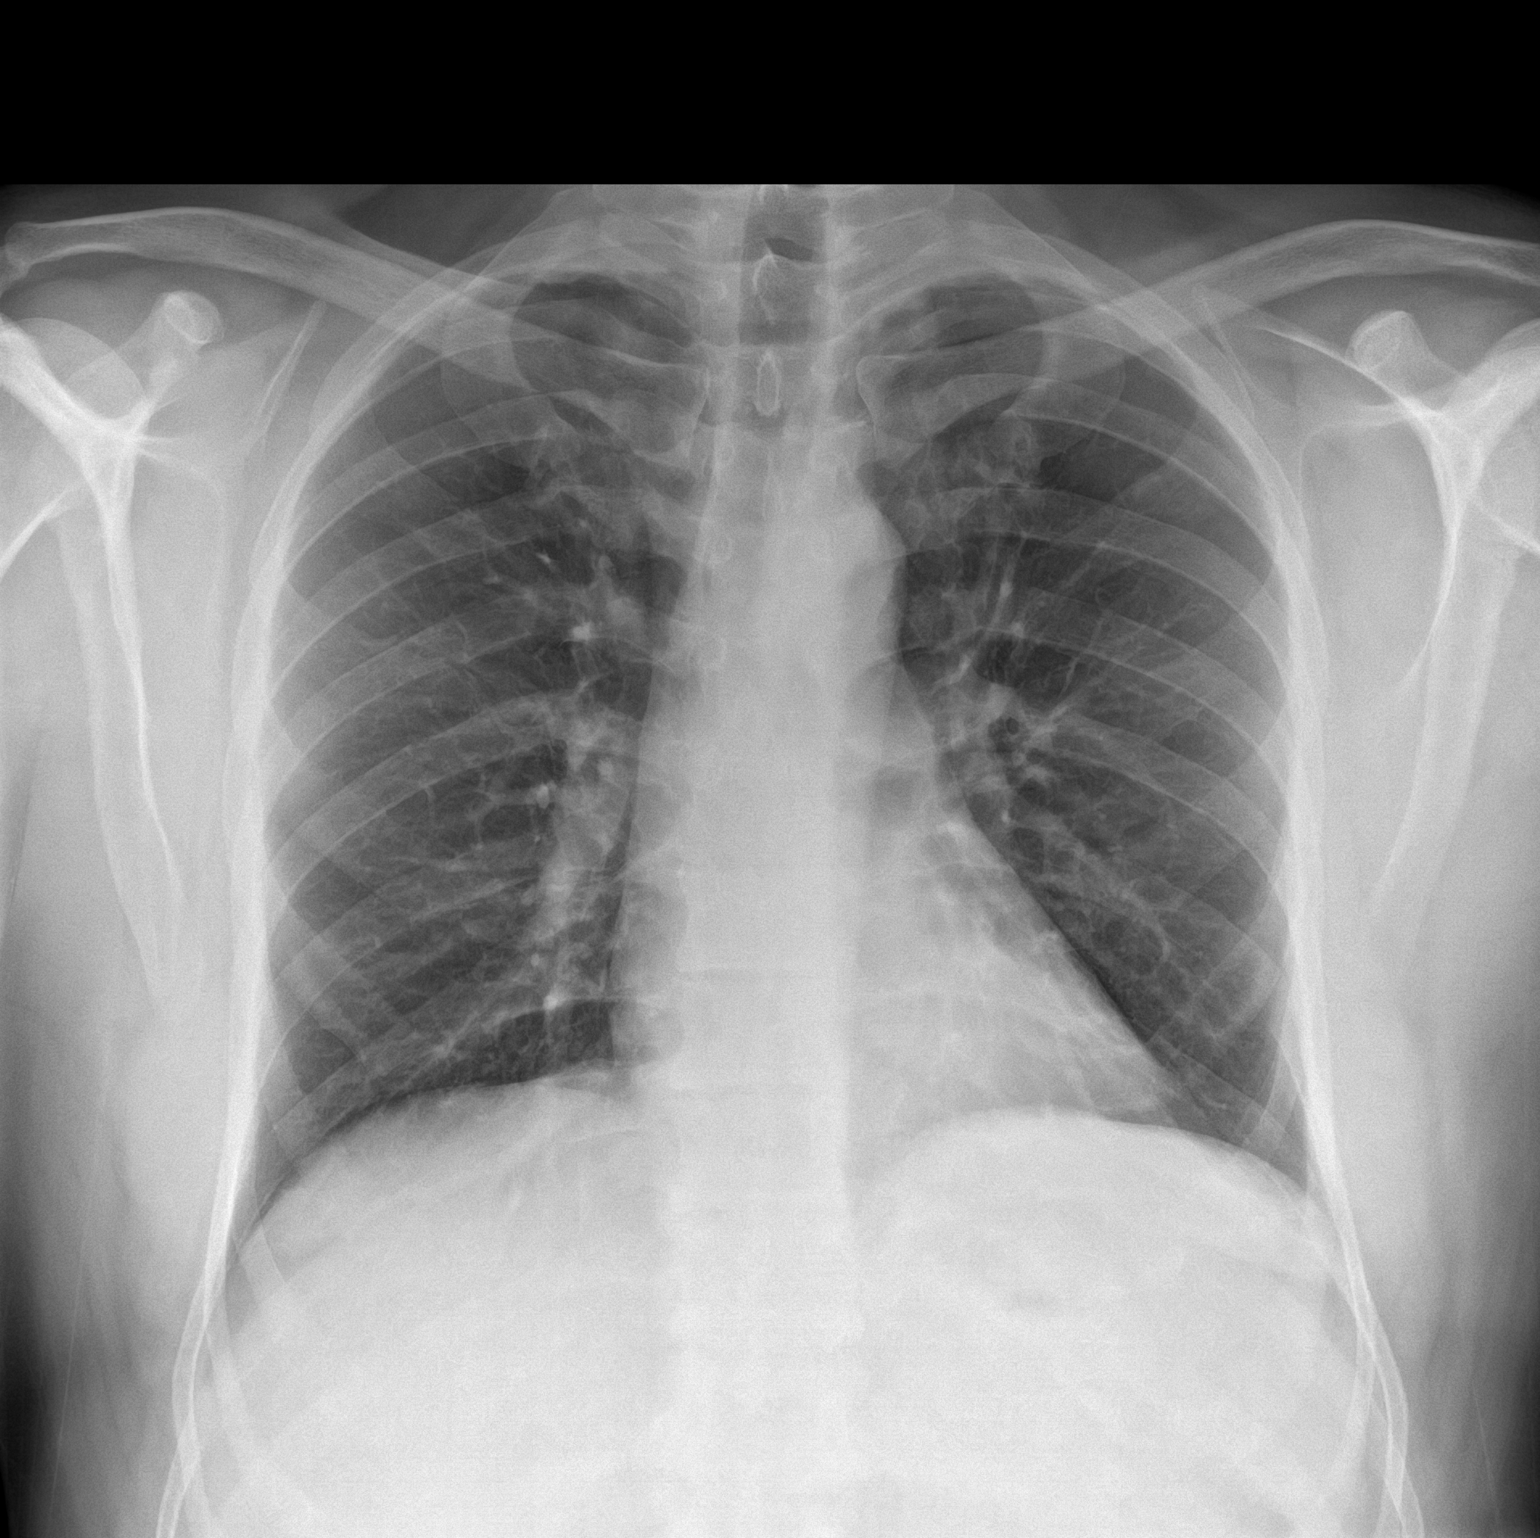

[chest lat]
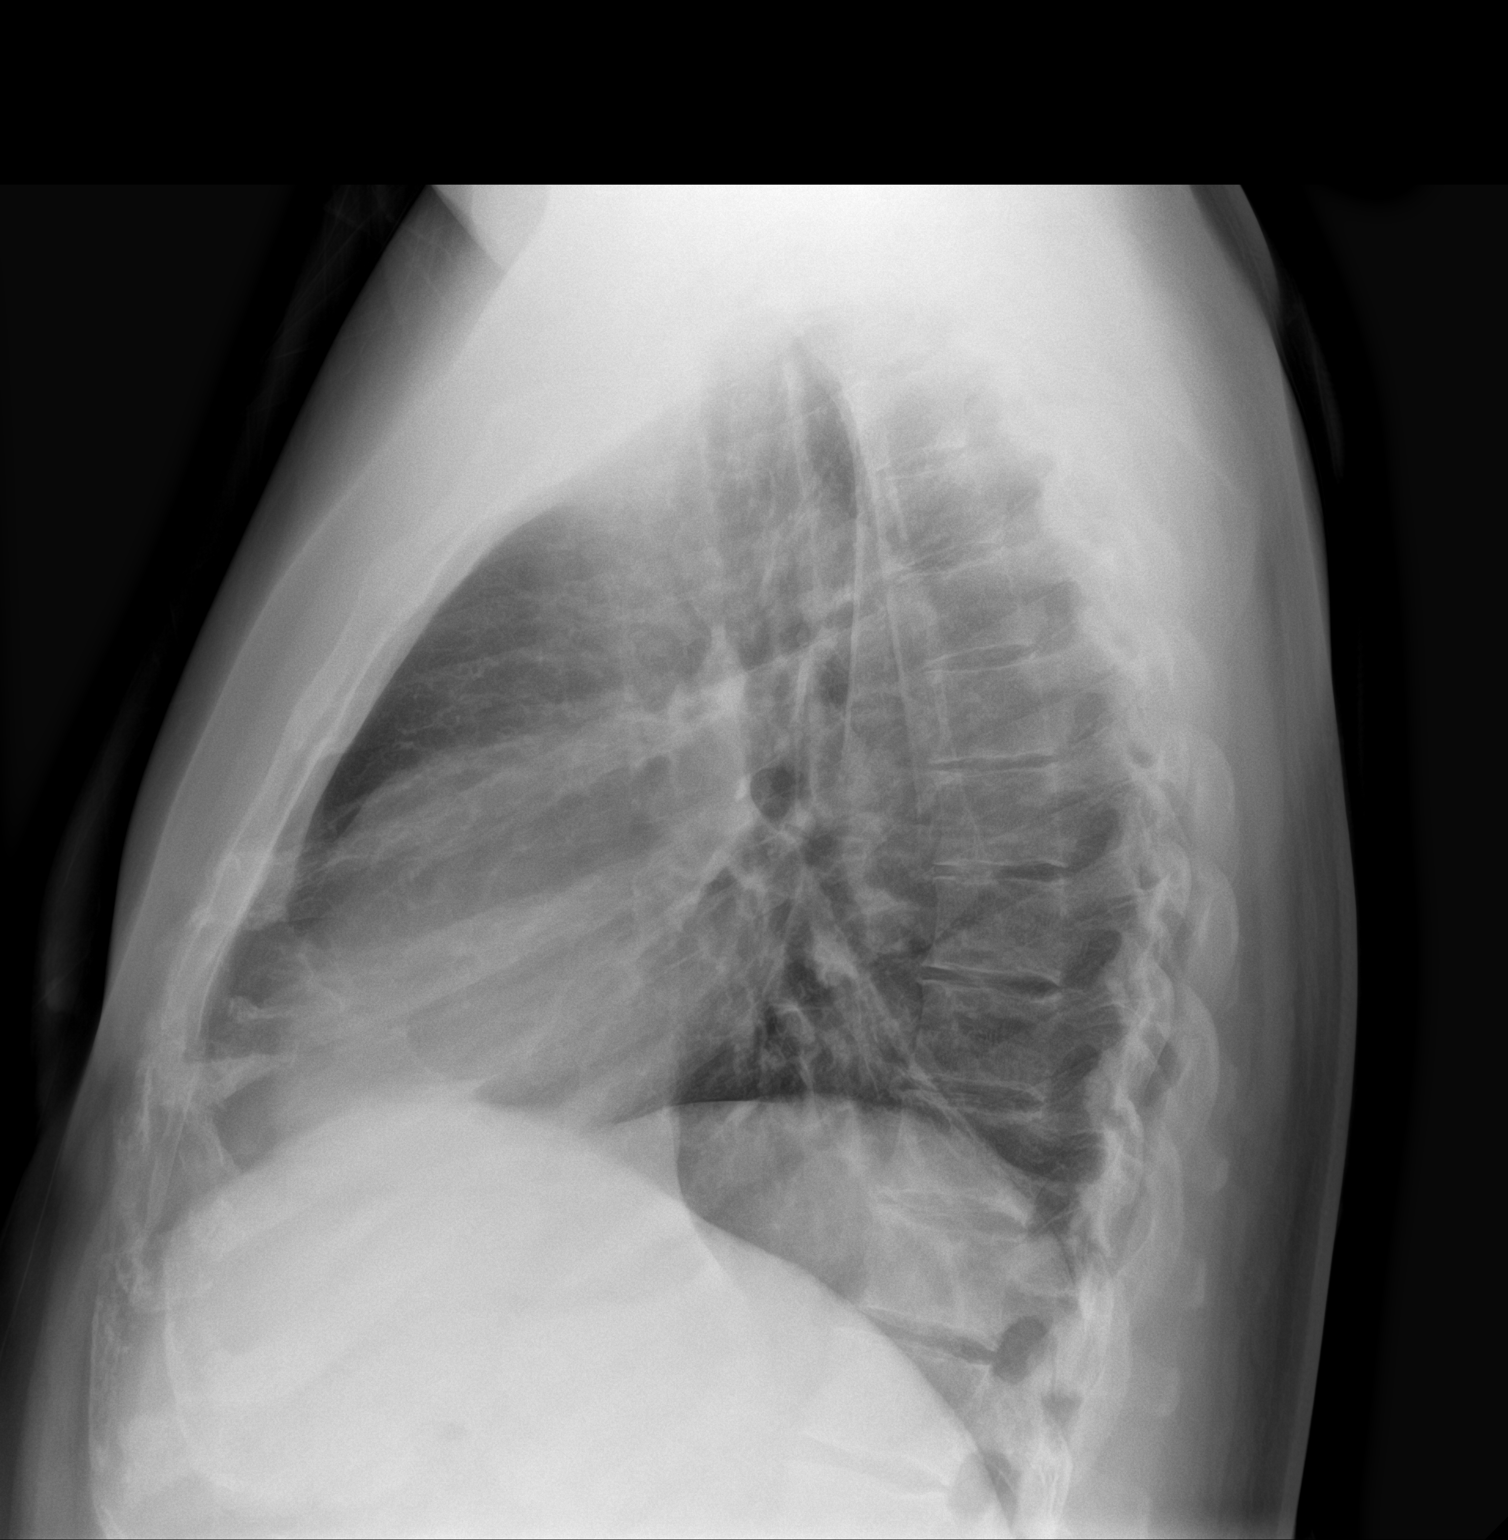

[2 of 2 positions shown; findings below may reference images not displayed]

FINDINGS: The heart size and mediastinal contours are within normal limits.
Both lungs are clear. The visualized skeletal structures are
unremarkable.
IMPRESSION: No active cardiopulmonary disease.

## 2023-04-28 ENCOUNTER — Encounter: Payer: Self-pay | Admitting: Nurse Practitioner

## 2023-05-08 ENCOUNTER — Other Ambulatory Visit: Payer: Managed Care, Other (non HMO)

## 2023-05-08 DIAGNOSIS — Z1159 Encounter for screening for other viral diseases: Secondary | ICD-10-CM

## 2023-05-08 DIAGNOSIS — E349 Endocrine disorder, unspecified: Secondary | ICD-10-CM

## 2023-05-12 LAB — TESTOSTERONE, FREE, TOTAL, SHBG
Sex Hormone Binding: 34.8 nmol/L (ref 16.5–55.9)
Testosterone, Free: 5.3 pg/mL — ABNORMAL LOW (ref 6.8–21.5)
Testosterone: 371 ng/dL (ref 264–916)

## 2023-05-12 LAB — HEPATITIS C ANTIBODY: Hep C Virus Ab: NONREACTIVE

## 2023-05-12 LAB — HIV ANTIBODY (ROUTINE TESTING W REFLEX): HIV Screen 4th Generation wRfx: NONREACTIVE

## 2023-05-15 ENCOUNTER — Ambulatory Visit: Payer: Self-pay | Admitting: Nurse Practitioner

## 2023-05-15 DIAGNOSIS — E349 Endocrine disorder, unspecified: Secondary | ICD-10-CM

## 2023-05-15 NOTE — Telephone Encounter (Signed)
 Copied from CRM [id number]. Topic: Clinical - Lab/Test Results >> May 15, 2023  2:17 PM Clayton Bibles wrote: Reason for CRM: I read Minerva Areola the results from his testosterone study. He wants NP Early to call in the testosterone replacement and he will give the injections at home. He will call back to schedule a recheck for labs. Please call medication to [facility].  He understands everything. Thanks

## 2023-05-17 MED ORDER — TESTOSTERONE CYPIONATE 100 MG/ML IM SOLN: 50.0000 mg | INTRAMUSCULAR | 0 refills | Status: DC

## 2023-05-17 NOTE — Addendum Note (Signed)
 Addended by: Nubia Ziesmer, Huntley Dec E on: 05/17/2023 05:37 PM   Modules accepted: Orders

## 2023-05-19 ENCOUNTER — Other Ambulatory Visit: Payer: Self-pay | Admitting: Nurse Practitioner

## 2023-05-19 ENCOUNTER — Telehealth: Payer: Self-pay

## 2023-05-19 ENCOUNTER — Encounter: Payer: Self-pay | Admitting: Nurse Practitioner

## 2023-05-19 DIAGNOSIS — E349 Endocrine disorder, unspecified: Secondary | ICD-10-CM

## 2023-05-19 MED ORDER — TESTOSTERONE CYPIONATE 200 MG/ML IJ SOLN: 200.0000 mg | INTRAMUSCULAR | 5 refills | Status: DC

## 2023-05-19 NOTE — Telephone Encounter (Signed)
 Copied from CRM [id number]. Topic: Clinical - Prescription Issue >> May 19, 2023 11:28 AM Geroge Baseman wrote: Reason for CRM: patient states that his meds were 100 mg once every two weeks, the dosage was supposed to be increased, now it states 50 mg every 1 week, which is the same thing. Patient is wanting a clarify these directions and he would like a response through mychart if possible he would not prefer a call back.

## 2023-05-31 ENCOUNTER — Encounter: Payer: Self-pay | Admitting: Nurse Practitioner

## 2023-06-02 ENCOUNTER — Other Ambulatory Visit: Payer: Self-pay | Admitting: Nurse Practitioner

## 2023-06-02 DIAGNOSIS — F909 Attention-deficit hyperactivity disorder, unspecified type: Secondary | ICD-10-CM

## 2023-06-02 NOTE — Telephone Encounter (Signed)
 Copied from CRM [id number]. Topic: Clinical - Medication Refill >> Jun 02, 2023  1:57 PM Patsy Lager T wrote: Most Recent Primary Care Visit:  Provider: [facility]  Department: [facility]  Visit Type: LAB  Date: 05/08/2023  Medication: amphetamine-dextroamphetamine (ADDERALL) 20 MG tablet   Has the patient contacted their pharmacy? No   Is this the correct pharmacy for this prescription? Yes If no, delete pharmacy and type the correct one.  This is the patient's preferred pharmacy:  [facility] [id number] [place], [place] - [address] [place] [address] Phone: [phone] Fax: [phone]   Has the prescription been filled recently? Yes  Is the patient out of the medication? Yes  Has the patient been seen for an appointment in the last year OR does the patient have an upcoming appointment? Yes  Can we respond through MyChart? No  Agent: Please be advised that Rx refills may take up to 3 business days. We ask that you follow-up with your pharmacy.  Patient said he took his last pill on Wednesday

## 2023-06-02 NOTE — Telephone Encounter (Signed)
 Last apt 11/25/22

## 2023-06-05 ENCOUNTER — Other Ambulatory Visit: Payer: Self-pay | Admitting: Nurse Practitioner

## 2023-06-05 DIAGNOSIS — F909 Attention-deficit hyperactivity disorder, unspecified type: Secondary | ICD-10-CM

## 2023-06-05 MED ORDER — AMPHETAMINE-DEXTROAMPHETAMINE 20 MG PO TABS: ORAL_TABLET | ORAL | 0 refills | Status: DC

## 2023-06-05 MED ORDER — AMPHETAMINE-DEXTROAMPHETAMINE 20 MG PO TABS
20.0000 mg | ORAL_TABLET | Freq: Two times a day (BID) | ORAL | 0 refills | Status: DC
Start: 2023-06-05 — End: 2023-11-03

## 2023-06-05 MED ORDER — AMPHETAMINE-DEXTROAMPHETAMINE 20 MG PO TABS: 20.0000 mg | ORAL_TABLET | Freq: Two times a day (BID) | ORAL | 0 refills | Status: DC

## 2023-06-08 ENCOUNTER — Ambulatory Visit: Admitting: Nurse Practitioner

## 2023-09-21 ENCOUNTER — Other Ambulatory Visit: Payer: Self-pay | Admitting: Nurse Practitioner

## 2023-09-21 DIAGNOSIS — F909 Attention-deficit hyperactivity disorder, unspecified type: Secondary | ICD-10-CM

## 2023-09-21 MED ORDER — AMPHETAMINE-DEXTROAMPHETAMINE 20 MG PO TABS: 20.0000 mg | ORAL_TABLET | Freq: Two times a day (BID) | ORAL | 0 refills | Status: DC

## 2023-09-21 NOTE — Telephone Encounter (Signed)
 Copied from CRM [id number]. Topic: Clinical - Medication Refill >> Sep 21, 2023 12:08 PM Fredrica W wrote: Medication: amphetamine -dextroamphetamine  (ADDERALL) 20 MG tablet  Has the patient contacted their pharmacy? Yes (Agent: If no, request that the patient contact the pharmacy for the refill. If patient does not wish to contact the pharmacy document the reason why and proceed with request.) (Agent: If yes, when and what did the pharmacy advise?) New Rx needed   This is the patient's preferred pharmacy:  [facility] [id number] [place], [place] - [address] [place] [address] Phone: [phone] Fax: [phone]  Is this the correct pharmacy for this prescription? Yes If no, delete pharmacy and type the correct one.   Has the prescription been filled recently? Yes  Is the patient out of the medication? Yes   Has the patient been seen for an appointment in the last year OR does the patient have an upcoming appointment? Yes  Can we respond through MyChart? Yes  Agent: Please be advised that Rx refills may take up to 3 business days. We ask that you follow-up with your pharmacy.

## 2023-09-21 NOTE — Telephone Encounter (Signed)
 Last apt 11/25/22

## 2023-09-21 NOTE — Telephone Encounter (Unsigned)
 Copied from CRM [id number]. Topic: Appointments - Scheduling Inquiry for Clinic >> Sep 21, 2023 12:46 PM Graeme ORN wrote: Reason for CRM: Patient called. Had requested refill earlier.Received a message from Juliene that he needs to schedule physical. Message specifically said physical. No appts open for physical until March. CAL closed for lunch. Unable to confirm if ok to schedule office visit. Thank You    ----------------------------------------------------------------------- From previous Reason for Contact - Scheduling: Patient/patient representative is calling to schedule an appointment. Refer to attachments for appointment information. >> Sep 21, 2023  1:49 PM Rosina PARAS wrote: Spoke with pt and he can only schedule cpe after 9/20, he states he has not got his september schedule and will call once he receives it.

## 2023-10-04 ENCOUNTER — Encounter: Payer: Self-pay | Admitting: Nurse Practitioner

## 2023-10-08 ENCOUNTER — Telehealth: Admitting: Physician Assistant

## 2023-10-08 DIAGNOSIS — J069 Acute upper respiratory infection, unspecified: Secondary | ICD-10-CM

## 2023-10-08 MED ORDER — BENZONATATE 100 MG PO CAPS: 100.0000 mg | ORAL_CAPSULE | Freq: Three times a day (TID) | ORAL | 0 refills | Status: DC | PRN

## 2023-10-08 NOTE — Progress Notes (Signed)

## 2023-10-15 ENCOUNTER — Telehealth: Admitting: Family

## 2023-10-15 DIAGNOSIS — J029 Acute pharyngitis, unspecified: Secondary | ICD-10-CM | POA: Diagnosis not present

## 2023-10-15 MED ORDER — AMOXICILLIN 500 MG PO CAPS: 500.0000 mg | ORAL_CAPSULE | Freq: Two times a day (BID) | ORAL | 0 refills | Status: AC

## 2023-10-15 NOTE — Progress Notes (Signed)
E-Visit for Sore Throat - Strep Symptoms ? ?We are sorry that you are not feeling well.  Here is how we plan to help! ? ?Based on what you have shared with me it is likely that you have strep pharyngitis.  Strep pharyngitis is inflammation and infection in the back of the throat.  This is an infection cause by bacteria and is treated with antibiotics.  I have prescribed Amoxicillin 500 mg twice a day for 10 days. For throat pain, we recommend over the counter oral pain relief medications such as acetaminophen or aspirin, or anti-inflammatory medications such as ibuprofen or naproxen sodium. Topical treatments such as oral throat lozenges or sprays may be used as needed. Strep infections are not as easily transmitted as other respiratory infections, however we still recommend that you avoid close contact with loved ones, especially the very young and elderly.  Remember to wash your hands thoroughly throughout the day as this is the number one way to prevent the spread of infection and wipe down door knobs and counters with disinfectant. ? ? ?Home Care: ?Only take medications as instructed by your medical team. ?Complete the entire course of an antibiotic. ?Do not take these medications with alcohol. ?A steam or ultrasonic humidifier can help congestion.  You can place a towel over your head and breathe in the steam from hot water coming from a faucet. ?Avoid close contacts especially the very young and the elderly. ?Cover your mouth when you cough or sneeze. ?Always remember to wash your hands. ? ?Get Help Right Away If: ?You develop worsening fever or sinus pain. ?You develop a severe head ache or visual changes. ?Your symptoms persist after you have completed your treatment plan. ? ?Make sure you ?Understand these instructions. ?Will watch your condition. ?Will get help right away if you are not doing well or get worse. ? ? ?Thank you for choosing an e-visit. ? ?Your e-visit answers were reviewed by a board  certified advanced clinical practitioner to complete your personal care plan. Depending upon the condition, your plan could have included both over the counter or prescription medications. ? ?Please review your pharmacy choice. Make sure the pharmacy is open so you can pick up prescription now. If there is a problem, you may contact your provider through MyChart messaging and have the prescription routed to another pharmacy.  Your safety is important to us. If you have drug allergies check your prescription carefully.  ? ?For the next 24 hours you can use MyChart to ask questions about today's visit, request a non-urgent call back, or ask for a work or school excuse. ?You will get an email in the next two days asking about your experience. I hope that your e-visit has been valuable and will speed your recovery. ? ?Approximately 5 minutes was spent documenting and reviewing patient's chart.  ? ? ?

## 2023-10-19 ENCOUNTER — Telehealth: Payer: Self-pay

## 2023-10-19 ENCOUNTER — Other Ambulatory Visit: Payer: Self-pay

## 2023-10-19 DIAGNOSIS — Z1159 Encounter for screening for other viral diseases: Secondary | ICD-10-CM

## 2023-10-19 NOTE — Telephone Encounter (Signed)
 Copied from CRM [id number]. Topic: Clinical - Request for Lab/Test Order >> Oct 19, 2023  9:53 AM Graeme ORN wrote: Reason for CRM: Patient called. States he needs to Have Hep B titer drawn. Requesting order for Hep B Titer.

## 2023-10-23 ENCOUNTER — Other Ambulatory Visit

## 2023-10-23 DIAGNOSIS — Z1159 Encounter for screening for other viral diseases: Secondary | ICD-10-CM

## 2023-10-24 LAB — HEPATITIS B SURFACE ANTIBODY, QUANTITATIVE: Hepatitis B Surf Ab Quant: 49.9 m[IU]/mL

## 2023-10-25 ENCOUNTER — Ambulatory Visit: Payer: Self-pay | Admitting: Nurse Practitioner

## 2023-10-30 ENCOUNTER — Other Ambulatory Visit: Payer: Self-pay

## 2023-10-30 DIAGNOSIS — Z111 Encounter for screening for respiratory tuberculosis: Secondary | ICD-10-CM

## 2023-10-31 ENCOUNTER — Other Ambulatory Visit

## 2023-11-02 ENCOUNTER — Encounter: Payer: Self-pay | Admitting: Nurse Practitioner

## 2023-11-02 ENCOUNTER — Other Ambulatory Visit

## 2023-11-02 DIAGNOSIS — Z111 Encounter for screening for respiratory tuberculosis: Secondary | ICD-10-CM

## 2023-11-03 ENCOUNTER — Other Ambulatory Visit: Payer: Self-pay | Admitting: Nurse Practitioner

## 2023-11-03 DIAGNOSIS — E349 Endocrine disorder, unspecified: Secondary | ICD-10-CM

## 2023-11-03 DIAGNOSIS — F909 Attention-deficit hyperactivity disorder, unspecified type: Secondary | ICD-10-CM

## 2023-11-03 MED ORDER — AMPHETAMINE-DEXTROAMPHETAMINE 20 MG PO TABS: ORAL_TABLET | ORAL | 0 refills | Status: DC

## 2023-11-03 MED ORDER — AMPHETAMINE-DEXTROAMPHETAMINE 20 MG PO TABS: 20.0000 mg | ORAL_TABLET | Freq: Two times a day (BID) | ORAL | 0 refills | Status: DC

## 2023-11-03 MED ORDER — TESTOSTERONE CYPIONATE 200 MG/ML IJ SOLN: 200.0000 mg | INTRAMUSCULAR | 5 refills | Status: AC

## 2023-11-05 LAB — QUANTIFERON-TB GOLD PLUS
QuantiFERON Mitogen Value: >10 [IU]/mL
QuantiFERON Nil Value: 0.04 [IU]/mL
QuantiFERON TB1 Ag Value: 0.04 [IU]/mL
QuantiFERON TB2 Ag Value: 0.04 [IU]/mL
QuantiFERON-TB Gold Plus: NEGATIVE

## 2023-11-07 ENCOUNTER — Ambulatory Visit: Payer: Self-pay | Admitting: Nurse Practitioner

## 2023-11-07 ENCOUNTER — Encounter: Payer: Self-pay | Admitting: Nurse Practitioner

## 2024-02-13 ENCOUNTER — Encounter: Payer: Self-pay | Admitting: Nurse Practitioner

## 2024-02-14 MED ORDER — AMPHETAMINE-DEXTROAMPHETAMINE 20 MG PO TABS: ORAL_TABLET | ORAL | 0 refills | Status: DC

## 2024-02-14 MED ORDER — AMPHETAMINE-DEXTROAMPHETAMINE 20 MG PO TABS: 20.0000 mg | ORAL_TABLET | Freq: Two times a day (BID) | ORAL | 0 refills | Status: AC

## 2024-02-27 ENCOUNTER — Ambulatory Visit: Payer: Self-pay | Admitting: Nurse Practitioner

## 2024-03-04 ENCOUNTER — Encounter: Payer: Self-pay | Admitting: Nurse Practitioner

## 2024-03-04 ENCOUNTER — Ambulatory Visit: Admitting: Nurse Practitioner

## 2024-03-04 VITALS — BP 124/82 | HR 97 | Wt 251.2 lb

## 2024-03-04 DIAGNOSIS — Z130 Encounter for screening for diseases of the blood and blood-forming organs and certain disorders involving the immune mechanism: Secondary | ICD-10-CM

## 2024-03-04 DIAGNOSIS — E349 Endocrine disorder, unspecified: Secondary | ICD-10-CM

## 2024-03-04 DIAGNOSIS — Z1329 Encounter for screening for other suspected endocrine disorder: Secondary | ICD-10-CM

## 2024-03-04 DIAGNOSIS — Z1321 Encounter for screening for nutritional disorder: Secondary | ICD-10-CM | POA: Diagnosis not present

## 2024-03-04 DIAGNOSIS — B9689 Other specified bacterial agents as the cause of diseases classified elsewhere: Secondary | ICD-10-CM

## 2024-03-04 DIAGNOSIS — F900 Attention-deficit hyperactivity disorder, predominantly inattentive type: Secondary | ICD-10-CM | POA: Diagnosis not present

## 2024-03-04 DIAGNOSIS — J069 Acute upper respiratory infection, unspecified: Secondary | ICD-10-CM

## 2024-03-04 DIAGNOSIS — Z13228 Encounter for screening for other metabolic disorders: Secondary | ICD-10-CM | POA: Diagnosis not present

## 2024-03-04 DIAGNOSIS — F909 Attention-deficit hyperactivity disorder, unspecified type: Secondary | ICD-10-CM

## 2024-03-04 MED ORDER — AZITHROMYCIN 250 MG PO TABS: ORAL_TABLET | ORAL | 0 refills | Status: AC

## 2024-03-04 MED ORDER — PREDNISONE 20 MG PO TABS: 40.0000 mg | ORAL_TABLET | Freq: Every day | ORAL | 0 refills | Status: AC

## 2024-03-04 MED ORDER — AMPHETAMINE-DEXTROAMPHETAMINE 20 MG PO TABS: ORAL_TABLET | ORAL | 0 refills | Status: AC

## 2024-03-04 MED ORDER — HYDROCODONE BIT-HOMATROP MBR 5-1.5 MG/5ML PO SOLN: 5.0000 mL | Freq: Every evening | ORAL | 0 refills | Status: AC | PRN

## 2024-03-04 NOTE — Assessment & Plan Note (Signed)
 Anthony Madden

## 2024-03-04 NOTE — Progress Notes (Unsigned)
{  SEHM (Optional):34217}  [name], DNP, AGNP-c [facility]  [address] [place], [place] [address] [phone]   ESTABLISHED PATIENT- Chronic Health and/or Follow-Up Visit on 03/04/2024  Blood pressure 124/82, pulse 97, weight 251 lb 3.2 oz (113.9 kg).   Subjective:  Medical Management of Chronic Issues (Med check f/u, ) Patient was last seen for his annual exam in September 2024. His most recent labs were in March of 2025.  ***  ROS negative except for what is listed in HPI. History, Medications, Surgery, SDOH, and Family History reviewed and updated as appropriate.  Objective:  Physical Exam Vitals and nursing note reviewed.  Constitutional:      Appearance: Normal appearance.  HENT:     Head: Normocephalic.  Eyes:     Pupils: Pupils are equal, round, and reactive to light.  Cardiovascular:     Rate and Rhythm: Normal rate and regular rhythm.     Pulses: Normal pulses.     Heart sounds: Normal heart sounds.  Pulmonary:     Effort: Pulmonary effort is normal.     Breath sounds: Normal breath sounds.  Musculoskeletal:        General: Normal range of motion.     Cervical back: Normal range of motion.  Skin:    General: Skin is warm.  Neurological:     General: No focal deficit present.     Mental Status: He is alert and oriented to person, place, and time.  Psychiatric:        Mood and Affect: Mood normal.         Assessment & Plan:   Assessment & Plan Attention deficit hyperactivity disorder (ADHD), predominantly inattentive type     Testosterone  deficiency     Screening for endocrine, nutritional, metabolic and immunity disorder       Anthony FORBES Doing, DNP, AGNP-c  {SETIMEYorN (Optional):34216}

## 2024-03-06 ENCOUNTER — Ambulatory Visit: Payer: Self-pay | Admitting: Nurse Practitioner

## 2024-03-06 LAB — TESTT+TESTF+SHBG
Sex Hormone Binding: 36.9 nmol/L (ref 16.5–55.9)
Testosterone, Free: 6.6 pg/mL — ABNORMAL LOW (ref 6.8–21.5)
Testosterone, Total, LC/MS: 422 ng/dL (ref 264.0–916.0)

## 2024-03-06 LAB — CBC WITH DIFFERENTIAL/PLATELET
Basophils Absolute: 0 x10E3/uL (ref 0.0–0.2)
Basos: 0 %
EOS (ABSOLUTE): 0.1 x10E3/uL (ref 0.0–0.4)
Eos: 1 %
Hematocrit: 45 % (ref 37.5–51.0)
Hemoglobin: 14.7 g/dL (ref 13.0–17.7)
Immature Grans (Abs): 0 x10E3/uL (ref 0.0–0.1)
Immature Granulocytes: 0 %
Lymphocytes Absolute: 1.8 x10E3/uL (ref 0.7–3.1)
Lymphs: 29 %
MCH: 27.9 pg (ref 26.6–33.0)
MCHC: 32.7 g/dL (ref 31.5–35.7)
MCV: 86 fL (ref 79–97)
Monocytes Absolute: 0.4 x10E3/uL (ref 0.1–0.9)
Monocytes: 6 %
Neutrophils Absolute: 3.9 x10E3/uL (ref 1.4–7.0)
Neutrophils: 64 %
Platelets: 251 x10E3/uL (ref 150–450)
RBC: 5.26 x10E6/uL (ref 4.14–5.80)
RDW: 13.1 % (ref 11.6–15.4)
WBC: 6.2 x10E3/uL (ref 3.4–10.8)

## 2024-03-06 LAB — COMPREHENSIVE METABOLIC PANEL WITH GFR
ALT: 33 IU/L (ref 0–44)
AST: 24 IU/L (ref 0–40)
Albumin: 4.6 g/dL (ref 4.1–5.1)
Alkaline Phosphatase: 62 IU/L (ref 47–123)
BUN/Creatinine Ratio: 19 (ref 9–20)
BUN: 19 mg/dL (ref 6–24)
Bilirubin Total: 0.4 mg/dL (ref 0.0–1.2)
CO2: 22 mmol/L (ref 20–29)
Calcium: 9.5 mg/dL (ref 8.7–10.2)
Chloride: 98 mmol/L (ref 96–106)
Creatinine, Ser: 0.98 mg/dL (ref 0.76–1.27)
Globulin, Total: 2.3 g/dL (ref 1.5–4.5)
Glucose: 75 mg/dL (ref 70–99)
Potassium: 4.2 mmol/L (ref 3.5–5.2)
Sodium: 135 mmol/L (ref 134–144)
Total Protein: 6.9 g/dL (ref 6.0–8.5)
eGFR: 98 mL/min/1.73

## 2024-08-23 ENCOUNTER — Encounter: Admitting: Nurse Practitioner
